# Patient Record
Sex: Female | Born: 1970
Health system: Southern US, Community
[De-identification: ages and names within clinical notes are randomized; demographics above are authoritative.]

## PROBLEM LIST (undated history)

## (undated) DIAGNOSIS — F419 Anxiety disorder, unspecified: Secondary | ICD-10-CM

## (undated) DIAGNOSIS — G709 Myoneural disorder, unspecified: Secondary | ICD-10-CM

## (undated) DIAGNOSIS — F329 Major depressive disorder, single episode, unspecified: Secondary | ICD-10-CM

## (undated) DIAGNOSIS — F32A Depression, unspecified: Secondary | ICD-10-CM

## (undated) HISTORY — PX: WISDOM TOOTH EXTRACTION: SHX21

---

## 2004-03-19 ENCOUNTER — Other Ambulatory Visit: Admission: RE | Admit: 2004-03-19 | Discharge: 2004-03-19 | Payer: Self-pay | Admitting: Family Medicine

## 2005-03-20 ENCOUNTER — Other Ambulatory Visit: Admission: RE | Admit: 2005-03-20 | Discharge: 2005-03-20 | Payer: Self-pay | Admitting: Internal Medicine

## 2006-05-04 ENCOUNTER — Other Ambulatory Visit: Admission: RE | Admit: 2006-05-04 | Discharge: 2006-05-04 | Payer: Self-pay | Admitting: Internal Medicine

## 2007-05-03 ENCOUNTER — Other Ambulatory Visit: Admission: RE | Admit: 2007-05-03 | Discharge: 2007-05-03 | Payer: Self-pay | Admitting: Internal Medicine

## 2008-05-12 ENCOUNTER — Other Ambulatory Visit: Admission: RE | Admit: 2008-05-12 | Discharge: 2008-05-12 | Payer: Self-pay | Admitting: Internal Medicine

## 2009-05-11 ENCOUNTER — Other Ambulatory Visit: Admission: RE | Admit: 2009-05-11 | Discharge: 2009-05-11 | Payer: Self-pay | Admitting: Geriatric Medicine

## 2010-05-24 ENCOUNTER — Other Ambulatory Visit: Admission: RE | Admit: 2010-05-24 | Discharge: 2010-05-24 | Payer: Self-pay | Admitting: Internal Medicine

## 2010-11-03 ENCOUNTER — Encounter: Payer: Self-pay | Admitting: Internal Medicine

## 2011-05-30 ENCOUNTER — Other Ambulatory Visit (HOSPITAL_COMMUNITY)
Admission: RE | Admit: 2011-05-30 | Discharge: 2011-05-30 | Disposition: A | Discharge status: Home / Self Care | Payer: PRIVATE HEALTH INSURANCE | Source: Ambulatory Visit | Attending: *Deleted | Admitting: *Deleted

## 2011-05-30 ENCOUNTER — Other Ambulatory Visit: Payer: Self-pay | Admitting: *Deleted

## 2011-05-30 DIAGNOSIS — Z01419 Encounter for gynecological examination (general) (routine) without abnormal findings: Secondary | ICD-10-CM | POA: Insufficient documentation

## 2012-07-05 ENCOUNTER — Other Ambulatory Visit: Payer: Self-pay | Admitting: Neurology

## 2012-07-05 DIAGNOSIS — H532 Diplopia: Secondary | ICD-10-CM

## 2012-07-07 ENCOUNTER — Ambulatory Visit
Admission: RE | Admit: 2012-07-07 | Discharge: 2012-07-07 | Disposition: A | Discharge status: Home / Self Care | Payer: BC Managed Care – PPO | Source: Ambulatory Visit | Attending: Neurology | Admitting: Neurology

## 2012-07-07 VITALS — BP 104/59 | HR 62

## 2012-07-07 DIAGNOSIS — H532 Diplopia: Secondary | ICD-10-CM

## 2012-07-07 LAB — CSF CELL COUNT WITH DIFFERENTIAL
RBC Count, CSF: 1 cu mm — ABNORMAL HIGH
Tube #: 4
WBC, CSF: 2 cu mm (ref 0–5)

## 2012-07-07 LAB — GLUCOSE, CSF: Glucose, CSF: 58 mg/dL (ref 43–76)

## 2012-07-07 LAB — PROTEIN, CSF: Total Protein, CSF: 40 mg/dL (ref 15–45)

## 2012-07-09 LAB — ANGIOTENSIN CONVERTING ENZYME, CSF: ACE, CSF: 3 U/L (ref <=15)

## 2012-07-10 LAB — CNS IGG SYNTHESIS RATE, CSF+BLOOD
Albumin, CSF: 24.2 mg/dL (ref 8.0–42.0)
Albumin, Serum(Neph): 4.5 g/dL (ref 3.5–4.9)
IgG Index, CSF: 0.65 (ref <0.66)
IgG, CSF: 2.7 mg/dL (ref 0.8–7.7)
IgG, Serum: 773 mg/dL (ref 694–1618)
MS CNS IgG Synthesis Rate: 1.3 mg/24 h (ref <=3.3)

## 2012-07-12 ENCOUNTER — Other Ambulatory Visit: Payer: Self-pay | Admitting: Neurology

## 2012-07-12 ENCOUNTER — Ambulatory Visit
Admission: RE | Admit: 2012-07-12 | Discharge: 2012-07-12 | Disposition: A | Discharge status: Home / Self Care | Payer: BC Managed Care – PPO | Source: Ambulatory Visit | Attending: Neurology | Admitting: Neurology

## 2012-07-12 VITALS — BP 115/66 | HR 67

## 2012-07-12 DIAGNOSIS — G971 Other reaction to spinal and lumbar puncture: Secondary | ICD-10-CM

## 2012-07-12 MED ORDER — IOHEXOL 180 MG/ML  SOLN
1.0000 mL | Freq: Once | INTRAMUSCULAR | Status: AC | PRN
  Administered 2012-07-12: 1 mL via EPIDURAL

## 2012-07-13 LAB — OLIGOCLONAL BANDS, CSF + SERM

## 2012-07-30 ENCOUNTER — Other Ambulatory Visit: Payer: Self-pay | Admitting: Obstetrics and Gynecology

## 2012-07-30 ENCOUNTER — Other Ambulatory Visit (HOSPITAL_COMMUNITY)
Admission: RE | Admit: 2012-07-30 | Discharge: 2012-07-30 | Disposition: A | Discharge status: Home / Self Care | Payer: BC Managed Care – PPO | Source: Ambulatory Visit | Attending: Obstetrics and Gynecology | Admitting: Obstetrics and Gynecology

## 2012-07-30 DIAGNOSIS — Z01419 Encounter for gynecological examination (general) (routine) without abnormal findings: Secondary | ICD-10-CM | POA: Insufficient documentation

## 2012-07-30 DIAGNOSIS — Z1151 Encounter for screening for human papillomavirus (HPV): Secondary | ICD-10-CM | POA: Insufficient documentation

## 2013-10-12 ENCOUNTER — Other Ambulatory Visit: Payer: Self-pay | Admitting: Psychiatry

## 2013-10-12 DIAGNOSIS — G35 Multiple sclerosis: Secondary | ICD-10-CM

## 2013-10-14 ENCOUNTER — Other Ambulatory Visit: Payer: BC Managed Care – PPO

## 2013-10-20 ENCOUNTER — Ambulatory Visit
Admission: RE | Admit: 2013-10-20 | Discharge: 2013-10-20 | Disposition: A | Discharge status: Home / Self Care | Payer: Federal, State, Local not specified - PPO | Source: Ambulatory Visit | Attending: Psychiatry | Admitting: Psychiatry

## 2013-10-20 DIAGNOSIS — G35 Multiple sclerosis: Secondary | ICD-10-CM

## 2013-10-20 MED ORDER — GADOBENATE DIMEGLUMINE 529 MG/ML IV SOLN
12.0000 mL | Freq: Once | INTRAVENOUS | Status: AC | PRN
  Administered 2013-10-20: 12 mL via INTRAVENOUS

## 2014-11-28 ENCOUNTER — Other Ambulatory Visit: Payer: Self-pay | Admitting: Psychiatry

## 2014-11-28 DIAGNOSIS — G35 Multiple sclerosis: Secondary | ICD-10-CM

## 2014-12-11 ENCOUNTER — Ambulatory Visit
Admission: RE | Admit: 2014-12-11 | Discharge: 2014-12-11 | Disposition: A | Discharge status: Home / Self Care | Payer: Federal, State, Local not specified - PPO | Source: Ambulatory Visit | Attending: Psychiatry | Admitting: Psychiatry

## 2014-12-11 DIAGNOSIS — G35 Multiple sclerosis: Secondary | ICD-10-CM

## 2014-12-11 MED ORDER — GADOBENATE DIMEGLUMINE 529 MG/ML IV SOLN
12.0000 mL | Freq: Once | INTRAVENOUS | Status: AC | PRN
  Administered 2014-12-11: 12 mL via INTRAVENOUS

## 2015-01-23 ENCOUNTER — Other Ambulatory Visit (HOSPITAL_COMMUNITY): Payer: Self-pay | Admitting: Psychiatry

## 2015-01-29 ENCOUNTER — Inpatient Hospital Stay (HOSPITAL_COMMUNITY): Admission: RE | Admit: 2015-01-29 | Payer: Federal, State, Local not specified - PPO | Source: Ambulatory Visit

## 2015-02-27 ENCOUNTER — Encounter (HOSPITAL_COMMUNITY): Payer: Federal, State, Local not specified - PPO

## 2015-03-27 ENCOUNTER — Encounter (HOSPITAL_COMMUNITY): Payer: Federal, State, Local not specified - PPO

## 2015-04-24 ENCOUNTER — Encounter (HOSPITAL_COMMUNITY): Payer: Federal, State, Local not specified - PPO

## 2015-10-18 ENCOUNTER — Other Ambulatory Visit (HOSPITAL_COMMUNITY)
Admission: RE | Admit: 2015-10-18 | Discharge: 2015-10-18 | Disposition: A | Discharge status: Home / Self Care | Payer: Federal, State, Local not specified - PPO | Source: Ambulatory Visit | Attending: Obstetrics and Gynecology | Admitting: Obstetrics and Gynecology

## 2015-10-18 ENCOUNTER — Other Ambulatory Visit: Payer: Self-pay | Admitting: Obstetrics and Gynecology

## 2015-10-18 DIAGNOSIS — Z1151 Encounter for screening for human papillomavirus (HPV): Secondary | ICD-10-CM | POA: Insufficient documentation

## 2015-10-18 DIAGNOSIS — Z01419 Encounter for gynecological examination (general) (routine) without abnormal findings: Secondary | ICD-10-CM | POA: Insufficient documentation

## 2015-10-19 ENCOUNTER — Other Ambulatory Visit: Payer: Self-pay | Admitting: Obstetrics and Gynecology

## 2015-10-19 DIAGNOSIS — Z1231 Encounter for screening mammogram for malignant neoplasm of breast: Secondary | ICD-10-CM

## 2015-10-22 LAB — CYTOLOGY - PAP

## 2015-10-29 ENCOUNTER — Other Ambulatory Visit: Payer: Self-pay | Admitting: Obstetrics and Gynecology

## 2015-11-08 ENCOUNTER — Ambulatory Visit: Payer: Federal, State, Local not specified - PPO

## 2015-11-13 ENCOUNTER — Ambulatory Visit: Payer: Federal, State, Local not specified - PPO

## 2015-11-20 ENCOUNTER — Other Ambulatory Visit: Payer: Self-pay | Admitting: Psychiatry

## 2015-11-20 DIAGNOSIS — G35 Multiple sclerosis: Secondary | ICD-10-CM

## 2015-11-25 ENCOUNTER — Other Ambulatory Visit: Payer: Federal, State, Local not specified - PPO

## 2015-12-09 ENCOUNTER — Ambulatory Visit
Admission: RE | Admit: 2015-12-09 | Discharge: 2015-12-09 | Disposition: A | Discharge status: Home / Self Care | Payer: Federal, State, Local not specified - PPO | Source: Ambulatory Visit | Attending: Psychiatry | Admitting: Psychiatry

## 2015-12-09 DIAGNOSIS — G35 Multiple sclerosis: Secondary | ICD-10-CM

## 2015-12-09 MED ORDER — GADOBENATE DIMEGLUMINE 529 MG/ML IV SOLN
12.0000 mL | Freq: Once | INTRAVENOUS | Status: AC | PRN
  Administered 2015-12-09: 12 mL via INTRAVENOUS

## 2015-12-14 ENCOUNTER — Encounter (HOSPITAL_COMMUNITY): Payer: Self-pay

## 2015-12-14 ENCOUNTER — Encounter (HOSPITAL_COMMUNITY)
Admission: RE | Admit: 2015-12-14 | Discharge: 2015-12-14 | Disposition: A | Discharge status: Home / Self Care | Payer: Federal, State, Local not specified - PPO | Source: Ambulatory Visit | Attending: Obstetrics and Gynecology | Admitting: Obstetrics and Gynecology

## 2015-12-14 DIAGNOSIS — Z01812 Encounter for preprocedural laboratory examination: Secondary | ICD-10-CM | POA: Diagnosis present

## 2015-12-14 HISTORY — DX: Anxiety disorder, unspecified: F41.9

## 2015-12-14 HISTORY — DX: Major depressive disorder, single episode, unspecified: F32.9

## 2015-12-14 HISTORY — DX: Myoneural disorder, unspecified: G70.9

## 2015-12-14 HISTORY — DX: Depression, unspecified: F32.A

## 2015-12-14 LAB — TYPE AND SCREEN
ABO/RH(D): AB NEG
Antibody Screen: NEGATIVE

## 2015-12-14 LAB — CBC
HCT: 41 % (ref 36.0–46.0)
Hemoglobin: 14.2 g/dL (ref 12.0–15.0)
MCH: 31.9 pg (ref 26.0–34.0)
MCHC: 34.6 g/dL (ref 30.0–36.0)
MCV: 92.1 fL (ref 78.0–100.0)
Platelets: 229 10*3/uL (ref 150–400)
RBC: 4.45 MIL/uL (ref 3.87–5.11)
RDW: 12.7 % (ref 11.5–15.5)
WBC: 6.4 10*3/uL (ref 4.0–10.5)

## 2015-12-14 LAB — ABO/RH: ABO/RH(D): AB NEG

## 2015-12-14 NOTE — Patient Instructions (Signed)
Your procedure is scheduled on: Friday December 21, 2015    Enter through the Main Entrance of [facility] at: 9:00 am   Pick up the phone at the desk and dial [phone].  Call this number if you have problems the morning of surgery: [phone].  Remember: Do NOT eat food: after midnight on Thursday  Do NOT drink clear liquids after: after midnight on Thursday  Take these medicines the morning of surgery with a SIP OF WATER: Adderall   Do NOT wear jewelry (body piercing), metal hair clips/bobby pins, make-up, or nail polish. Do NOT wear lotions, powders, or perfumes.  You may wear deoderant. Do NOT shave for 48 hours prior to surgery. Do NOT bring valuables to the hospital. Contacts, dentures, or bridgework may not be worn into surgery.  Have a responsible adult drive you home and stay with you for 24 hours after your procedure

## 2015-12-21 ENCOUNTER — Ambulatory Visit (HOSPITAL_COMMUNITY): Payer: Federal, State, Local not specified - PPO | Admitting: Anesthesiology

## 2015-12-21 ENCOUNTER — Encounter (HOSPITAL_COMMUNITY)
Admission: RE | Disposition: A | Discharge status: Home / Self Care | Payer: Self-pay | Source: Ambulatory Visit | Attending: Obstetrics and Gynecology

## 2015-12-21 ENCOUNTER — Encounter (HOSPITAL_COMMUNITY): Payer: Self-pay | Admitting: Anesthesiology

## 2015-12-21 ENCOUNTER — Ambulatory Visit (HOSPITAL_COMMUNITY)
Admission: RE | Admit: 2015-12-21 | Discharge: 2015-12-21 | Disposition: A | Discharge status: Home / Self Care | Payer: Federal, State, Local not specified - PPO | Source: Ambulatory Visit | Attending: Obstetrics and Gynecology | Admitting: Obstetrics and Gynecology

## 2015-12-21 DIAGNOSIS — D069 Carcinoma in situ of cervix, unspecified: Secondary | ICD-10-CM | POA: Insufficient documentation

## 2015-12-21 DIAGNOSIS — M5430 Sciatica, unspecified side: Secondary | ICD-10-CM | POA: Diagnosis not present

## 2015-12-21 DIAGNOSIS — G35 Multiple sclerosis: Secondary | ICD-10-CM | POA: Diagnosis not present

## 2015-12-21 DIAGNOSIS — E538 Deficiency of other specified B group vitamins: Secondary | ICD-10-CM | POA: Diagnosis not present

## 2015-12-21 DIAGNOSIS — Z87891 Personal history of nicotine dependence: Secondary | ICD-10-CM | POA: Insufficient documentation

## 2015-12-21 HISTORY — PX: CERVICAL CONIZATION W/BX: SHX1330

## 2015-12-21 LAB — PREGNANCY, URINE: Preg Test, Ur: NEGATIVE

## 2015-12-21 SURGERY — CONE BIOPSY, CERVIX
Anesthesia: General | Site: Cervix

## 2015-12-21 MED ORDER — LIDOCAINE-EPINEPHRINE 1 %-1:100000 IJ SOLN
INTRAMUSCULAR | Status: DC | PRN
  Administered 2015-12-21: 13 mL

## 2015-12-21 MED ORDER — CEFAZOLIN SODIUM-DEXTROSE 2-3 GM-% IV SOLR
INTRAVENOUS | Status: AC
  Filled 2015-12-21: qty 50

## 2015-12-21 MED ORDER — FENTANYL CITRATE (PF) 100 MCG/2ML IJ SOLN
INTRAMUSCULAR | Status: AC
Start: 2015-12-21 — End: 2015-12-21
  Filled 2015-12-21: qty 2

## 2015-12-21 MED ORDER — FENTANYL CITRATE (PF) 100 MCG/2ML IJ SOLN: 25.0000 ug | INTRAMUSCULAR | Status: DC | PRN

## 2015-12-21 MED ORDER — DEXAMETHASONE SODIUM PHOSPHATE 4 MG/ML IJ SOLN
INTRAMUSCULAR | Status: DC | PRN
  Administered 2015-12-21: 4 mg via INTRAVENOUS

## 2015-12-21 MED ORDER — METOCLOPRAMIDE HCL 5 MG/ML IJ SOLN: 10.0000 mg | Freq: Once | INTRAMUSCULAR | Status: DC | PRN

## 2015-12-21 MED ORDER — KETOROLAC TROMETHAMINE 30 MG/ML IJ SOLN
INTRAMUSCULAR | Status: AC
  Filled 2015-12-21: qty 1

## 2015-12-21 MED ORDER — LIDOCAINE HCL (CARDIAC) 20 MG/ML IV SOLN
INTRAVENOUS | Status: AC
  Filled 2015-12-21: qty 5

## 2015-12-21 MED ORDER — IODINE STRONG (LUGOLS) 5 % PO SOLN
ORAL | Status: AC
  Filled 2015-12-21: qty 1

## 2015-12-21 MED ORDER — ONDANSETRON HCL 4 MG/2ML IJ SOLN
INTRAMUSCULAR | Status: AC
  Filled 2015-12-21: qty 2

## 2015-12-21 MED ORDER — PROPOFOL 10 MG/ML IV BOLUS
INTRAVENOUS | Status: DC | PRN
  Administered 2015-12-21: 130 mg via INTRAVENOUS

## 2015-12-21 MED ORDER — CEFAZOLIN SODIUM-DEXTROSE 2-3 GM-% IV SOLR
2.0000 g | INTRAVENOUS | Status: AC
  Administered 2015-12-21: 2 g via INTRAVENOUS

## 2015-12-21 MED ORDER — OXYTOCIN 10 UNIT/ML IJ SOLN
INTRAMUSCULAR | Status: AC
Start: 2015-12-21 — End: 2015-12-21
  Filled 2015-12-21: qty 2

## 2015-12-21 MED ORDER — FENTANYL CITRATE (PF) 100 MCG/2ML IJ SOLN
INTRAMUSCULAR | Status: DC | PRN
  Administered 2015-12-21: 100 ug via INTRAVENOUS

## 2015-12-21 MED ORDER — MIDAZOLAM HCL 5 MG/5ML IJ SOLN
INTRAMUSCULAR | Status: DC | PRN
  Administered 2015-12-21: 2 mg via INTRAVENOUS

## 2015-12-21 MED ORDER — IODINE STRONG (LUGOLS) 5 % PO SOLN
ORAL | Status: DC | PRN
  Administered 2015-12-21: 0.2 mL via ORAL

## 2015-12-21 MED ORDER — CITRIC ACID-SODIUM CITRATE 334-500 MG/5ML PO SOLN
30.0000 mL | ORAL | Status: AC
  Administered 2015-12-21: 30 mL via ORAL

## 2015-12-21 MED ORDER — HYDROCODONE-ACETAMINOPHEN 7.5-325 MG PO TABS: 1.0000 | ORAL_TABLET | Freq: Once | ORAL | Status: DC | PRN

## 2015-12-21 MED ORDER — LACTATED RINGERS IV SOLN
INTRAVENOUS | Status: DC
  Administered 2015-12-21 (×2): via INTRAVENOUS

## 2015-12-21 MED ORDER — FERRIC SUBSULFATE 259 MG/GM EX SOLN
CUTANEOUS | Status: AC
  Filled 2015-12-21: qty 8

## 2015-12-21 MED ORDER — CITRIC ACID-SODIUM CITRATE 334-500 MG/5ML PO SOLN
ORAL | Status: AC
  Administered 2015-12-21: 30 mL via ORAL
  Filled 2015-12-21: qty 15

## 2015-12-21 MED ORDER — LIDOCAINE-EPINEPHRINE 1 %-1:100000 IJ SOLN
INTRAMUSCULAR | Status: AC
  Filled 2015-12-21: qty 1

## 2015-12-21 MED ORDER — PROPOFOL 10 MG/ML IV BOLUS
INTRAVENOUS | Status: AC
  Filled 2015-12-21: qty 20

## 2015-12-21 MED ORDER — DEXAMETHASONE SODIUM PHOSPHATE 4 MG/ML IJ SOLN
INTRAMUSCULAR | Status: AC
Start: 2015-12-21 — End: 2015-12-21
  Filled 2015-12-21: qty 1

## 2015-12-21 MED ORDER — METOCLOPRAMIDE HCL 5 MG/ML IJ SOLN
INTRAMUSCULAR | Status: DC | PRN
  Administered 2015-12-21: 10 mg via INTRAVENOUS

## 2015-12-21 MED ORDER — MEPERIDINE HCL 25 MG/ML IJ SOLN: 6.2500 mg | INTRAMUSCULAR | Status: DC | PRN

## 2015-12-21 MED ORDER — LIDOCAINE HCL (CARDIAC) 20 MG/ML IV SOLN
INTRAVENOUS | Status: DC | PRN
  Administered 2015-12-21 (×2): 30 mg via INTRAVENOUS

## 2015-12-21 MED ORDER — METOCLOPRAMIDE HCL 5 MG/ML IJ SOLN
INTRAMUSCULAR | Status: AC
  Filled 2015-12-21: qty 2

## 2015-12-21 MED ORDER — SCOPOLAMINE 1 MG/3DAYS TD PT72
MEDICATED_PATCH | TRANSDERMAL | Status: AC
  Administered 2015-12-21: 1.5 mg via TRANSDERMAL
  Filled 2015-12-21: qty 1

## 2015-12-21 MED ORDER — IBUPROFEN 800 MG PO TABS: 800.0000 mg | ORAL_TABLET | Freq: Three times a day (TID) | ORAL | Status: DC | PRN

## 2015-12-21 MED ORDER — SCOPOLAMINE 1 MG/3DAYS TD PT72
1.0000 | MEDICATED_PATCH | Freq: Once | TRANSDERMAL | Status: DC
  Administered 2015-12-21: 1.5 mg via TRANSDERMAL

## 2015-12-21 MED ORDER — ACETIC ACID 5 % SOLN
Status: AC
  Filled 2015-12-21: qty 500

## 2015-12-21 MED ORDER — ONDANSETRON HCL 4 MG/2ML IJ SOLN
INTRAMUSCULAR | Status: DC | PRN
  Administered 2015-12-21: 4 mg via INTRAVENOUS

## 2015-12-21 MED ORDER — MIDAZOLAM HCL 2 MG/2ML IJ SOLN
INTRAMUSCULAR | Status: AC
  Filled 2015-12-21: qty 2

## 2015-12-21 MED ORDER — KETOROLAC TROMETHAMINE 30 MG/ML IJ SOLN
INTRAMUSCULAR | Status: DC | PRN
  Administered 2015-12-21: 30 mg via INTRAVENOUS

## 2015-12-21 MED ORDER — MICROFIBRILLAR COLL HEMOSTAT EX POWD
CUTANEOUS | Status: AC
  Filled 2015-12-21: qty 5

## 2015-12-21 SURGICAL SUPPLY — 32 items
APPLICATOR ARISTA FLEXITIP XL (MISCELLANEOUS) ×2 IMPLANT
APPLICATOR COTTON TIP 6IN STRL (MISCELLANEOUS) ×2 IMPLANT
BLADE SURG 11 STRL SS (BLADE) ×2 IMPLANT
CATH ROBINSON RED A/P 16FR (CATHETERS) ×2 IMPLANT
CLOTH BEACON ORANGE TIMEOUT ST (SAFETY) ×2 IMPLANT
CONTAINER PREFILL 10% NBF 60ML (FORM) ×2 IMPLANT
COUNTER NEEDLE 1200 MAGNETIC (NEEDLE) ×2 IMPLANT
ELECT BALL LEEP 5MM RED (ELECTRODE) ×2 IMPLANT
ELECT REM PT RETURN 9FT ADLT (ELECTROSURGICAL) ×2
ELECTRODE REM PT RTRN 9FT ADLT (ELECTROSURGICAL) ×1 IMPLANT
GLOVE BIO SURGEON STRL SZ7 (GLOVE) ×2 IMPLANT
GLOVE BIOGEL PI IND STRL 7.0 (GLOVE) ×2 IMPLANT
GLOVE BIOGEL PI INDICATOR 7.0 (GLOVE) ×2
GOWN STRL REUS W/TWL LRG LVL3 (GOWN DISPOSABLE) ×4 IMPLANT
HEMOSTAT ARISTA ABSORB 3G PWDR (MISCELLANEOUS) ×2 IMPLANT
HEMOSTAT SURGICEL 2X3 (HEMOSTASIS) IMPLANT
NS IRRIG 1000ML POUR BTL (IV SOLUTION) ×2 IMPLANT
PACK VAGINAL MINOR WOMEN LF (CUSTOM PROCEDURE TRAY) ×2 IMPLANT
PAD OB MATERNITY 4.3X12.25 (PERSONAL CARE ITEMS) ×2 IMPLANT
PENCIL BUTTON HOLSTER BLD 10FT (ELECTRODE) IMPLANT
PENCIL SMOKE EVAC W/HOLSTER (ELECTROSURGICAL) ×2 IMPLANT
SCOPETTES 8  STERILE (MISCELLANEOUS) ×2
SCOPETTES 8 STERILE (MISCELLANEOUS) ×2 IMPLANT
SPONGE SURGIFOAM ABS GEL 12-7 (HEMOSTASIS) IMPLANT
SUT VIC AB 0 CT1 27 (SUTURE) ×2
SUT VIC AB 0 CT1 27XBRD ANBCTR (SUTURE) ×2 IMPLANT
SUT VIC AB 3-0 CT1 27 (SUTURE) ×3
SUT VIC AB 3-0 CT1 TAPERPNT 27 (SUTURE) ×3 IMPLANT
TOWEL OR 17X24 6PK STRL BLUE (TOWEL DISPOSABLE) ×4 IMPLANT
TUBING NON-CON 1/4 X 20 CONN (TUBING) ×2 IMPLANT
WATER STERILE IRR 1000ML POUR (IV SOLUTION) ×2 IMPLANT
YANKAUER SUCT BULB TIP NO VENT (SUCTIONS) ×2 IMPLANT

## 2015-12-21 NOTE — Transfer of Care (Signed)
Immediate Anesthesia Transfer of Care Note  Patient: Deborah Collins  Procedure(s) Performed: Procedure(s): CONIZATION CERVIX WITH BIOPSY (N/A)  Patient Location: PACU  Anesthesia Type:General  Level of Consciousness: awake, oriented and patient cooperative  Airway & Oxygen Therapy: Patient Spontanous Breathing and Patient connected to nasal cannula oxygen  Post-op Assessment: Report given to RN and Post -op Vital signs reviewed and stable  Post vital signs: Reviewed and stable  Last Vitals:  Filed Vitals:   12/21/15 0744  BP: 106/53  Pulse: 81  Temp: 36.7 C  Resp: 20    Complications: No apparent anesthesia complications

## 2015-12-21 NOTE — Brief Op Note (Signed)
12/21/2015  10:25 AM  PATIENT:  Deborah Collins  45 y.o. female  PRE-OPERATIVE DIAGNOSIS:  R87.613severe cervical dysplasia, MS on immunosuppressive meds  POST-OPERATIVE DIAGNOSIS:  severe cervical dysplasia  PROCEDURE:  Procedure(s): CONIZATION CERVIX WITH BIOPSY (N/A)  SURGEON:  Surgeon(s) and Role:    * Geryl Rankins, MD - Primary  PHYSICIAN ASSISTANT:   ASSISTANTS: Technician   ANESTHESIA:   local and general  EBL:  Total I/O In: 1400 [I.V.:1400] Out: 110 [Urine:100; Blood:10]  BLOOD ADMINISTERED:none  DRAINS: none   LOCAL MEDICATIONS USED:  LIDOCAINE  and OTHER w/ epinephrine 1:100,000  SPECIMEN:  Source of Specimen:  Cone biopsy of cervix, endocervical currettings  DISPOSITION OF SPECIMEN:  PATHOLOGY  COUNTS:  YES  TOURNIQUET:  * No tourniquets in log *  DICTATION: .Other Dictation: Dictation Number [id number]  PLAN OF CARE: Discharge to home after PACU  PATIENT DISPOSITION:  PACU - hemodynamically stable.   Delay start of Pharmacological VTE agent (>24hrs) due to surgical blood loss or risk of bleeding: yes

## 2015-12-21 NOTE — Anesthesia Procedure Notes (Signed)
Procedure Name: LMA Insertion Date/Time: 12/21/2015 9:24 AM Performed by: Janeece Agee Pre-anesthesia Checklist: Patient identified, Emergency Drugs available, Suction available, Patient being monitored and Timeout performed Patient Re-evaluated:Patient Re-evaluated prior to inductionOxygen Delivery Method: Circle system utilized Preoxygenation: Pre-oxygenation with 100% oxygen Intubation Type: IV induction LMA: LMA inserted LMA Size: 4.0 Grade View: Grade I Number of attempts: 1 Placement Confirmation: positive ETCO2 and breath sounds checked- equal and bilateral Dental Injury: Teeth and Oropharynx as per pre-operative assessment

## 2015-12-21 NOTE — Progress Notes (Signed)
Patient ID: Deborah Collins, female   DOB: 12/13/1970, 45 y.o.   MRN: [id number]   History of Present Illness  General:  Pt presents for colposcopy due to HGSIL/HPV+ pap.   Vital Signs  Wt 139, Wt change -.4 lb, Ht 69, BMI 20.52, Pulse sitting 103, BP sitting 122/82.   Physical Examination  GENERAL:  Patient appears alert and oriented.  General Appearance: well-appearing, well-developed, no acute distress.  Speech: clear.  FEMALE GENITOURINARY:  Cervix visualized, healthy appearing, no discharge, no lesions.  Vagina: pink/moist mucosa, no lesions, no abnormal discharge.  Vulva: normal, no lesions, no skin discoloration.     Current Medications  Taking   Valcyclovir 500 mg BID/5d 500 mg tabs one tab twice a day   Tysabri 300 MG/15ML Concentrate 15 ml monthly   Gabapentin 300 MG Capsule 2 capsule at bedtime   Klonopin 1 MG Tablet 1 tablet at bedtime as needed   Loestrin Fe 1/20 1-20 MG-MCG Tablet 1 tablet Daily for Three Weeks, 1 Week off   Adderall 20 mg 20 MG Capsule Extended Release 24 Hour 1 capsule in the morning twice a day    Past Medical History  Attention deficit disorder  Fever blisters  Allergic rhinitis  B12 deficiency  sciatica - has responded well to acupuncture by Damian Leavell  Insomnia  MS (dx 06/2012) - sxs possibly 7-10 yrs ago - Dr. Eduard Roux  Herpetic Whitlow right index finger - 03/2013  HSV 2   Surgical History  Denies Past Surgical History   Family History  Father: alive, hepatitis C, cirrhosis, diagnosed with HTN, Cirrhosis  Mother: deceased, esophageal cancer  denies any GYN family cancer hx.   Social History  General:  Tobacco use  cigarettes: Former smoker Quit in year 2014 Tobacco history last updated 10/29/2015 Additional Findings: Tobacco Non-User Non-smoker for personal reasons EXPOSURE TO PASSIVE SMOKE: quit January 2014, slipped up a couple of times.  Alcohol: yes, occasionally.  Caffeine: yes.  no Recreational drug use.   Exercise: 6x a week.  Marital Status: Single.  Children: none.  OCCUPATION: Working for [employer].    Gyn History  Sexual activity currently sexually active.  Periods : every month.  LMP 10/18/15.  Birth control ocp.  Last pap smear date 07/30/12, all negative, 10/18/15, HGSIL, + HPV.  Denies H/O Last mammogram date.  Denies H/O Abnormal pap smear.  Denies H/O STD.    OB History  Never been pregnant per patient.    Allergies  Lexapro: angry  Septra DS: hives   Hospitalization/Major Diagnostic Procedure  Denies Past Hospitalization   Assessments   1. HGSIL on Pap smear of cervix - R87.613 (Primary)   2. Cervical high risk HPV (human papillomavirus) test positive - R87.810   3. Negative pregnancy test - Z32.02   Treatment  1. HGSIL on Pap smear of cervix  PROCEDURE: GYN COLPO CERVIX & ADJACENT VAG W/BX  Biopsy showed HGSIL on cervix and ECC.  Recommended [facility].   2. Negative pregnancy test  LAB: Pregnancy Test, Urine   Procedures  Colposcopy:  Consent Consent signed. Possible risks explained to patient who states understanding.Marland Kitchen  Results See drawing for further details.  Type of lesion Acetowhite. Mosaicism, Biospy (11,1,6).  ECC done.  Impression CIN II.  Future management Repeat pap in 1 year if CIN 1, LEEP if CIN 2..        Labs    Lab: Pregnancy Test, Urine  Pregnancy Test, Urine negative  Notes :Allman,Michelle 10/29/2015 12:16:34 PM > Dr. V informed.     

## 2015-12-21 NOTE — Anesthesia Preprocedure Evaluation (Signed)
Anesthesia Evaluation  Patient identified by MRN, date of birth, ID band Patient awake    Reviewed: Allergy & Precautions, NPO status , Patient's Chart, lab work & pertinent test results  Airway Mallampati: I  TM Distance: >3 FB Neck ROM: Full    Dental no notable dental hx. (+) Teeth Intact   Pulmonary former smoker,    Pulmonary exam normal breath sounds clear to auscultation       Cardiovascular negative cardio ROS Normal cardiovascular exam Rhythm:Regular Rate:Normal     Neuro/Psych PSYCHIATRIC DISORDERS Anxiety Depression ADDMS diagnosed 3years ago on injections  Neuromuscular disease    GI/Hepatic negative GI ROS, Neg liver ROS,   Endo/Other  negative endocrine ROS  Renal/GU negative Renal ROS  negative genitourinary   Musculoskeletal negative musculoskeletal ROS (+)   Abdominal   Peds  Hematology negative hematology ROS (+)   Anesthesia Other Findings   Reproductive/Obstetrics Cervical dysplasia                             Anesthesia Physical Anesthesia Plan  ASA: II  Anesthesia Plan: General   Post-op Pain Management:    Induction: Intravenous  Airway Management Planned: LMA  Additional Equipment:   Intra-op Plan:   Post-operative Plan: Extubation in OR  Informed Consent: I have reviewed the patients History and Physical, chart, labs and discussed the procedure including the risks, benefits and alternatives for the proposed anesthesia with the patient or authorized representative who has indicated his/her understanding and acceptance.   Dental advisory given  Plan Discussed with: CRNA, Anesthesiologist and Surgeon  Anesthesia Plan Comments:         Anesthesia Quick Evaluation

## 2015-12-21 NOTE — Discharge Instructions (Addendum)
Conization of the Cervix   No Motrin until after 4PM today. Cervical conization is the cutting (excision) of a cone-shaped portion of the cervix. The procedure is performed through the vagina in either your health care provider's office or an operating room. This procedure is usually done when there is abnormal bleeding from the cervix. It can also be done to evaluate an abnormal Pap test or if an abnormality is seen on the cervix during an exam. The tissue is then examined to see if there are precancerous cells or cancer present.  Conization of the cervix is not done during a menstrual period or pregnancy.  LET [facility] CARE PROVIDER KNOW ABOUT:  Any allergies you have.   All medicines you are taking, including vitamins, herbs, eye drops, creams, and over-the-counter medicines.   Previous problems you or members of your family have had with the use of anesthetics.   Any blood disorders you have.   Previous surgeries you have had.   Medical conditions you have.   Your smoking habits.   The possibility of being pregnant.  RISKS AND COMPLICATIONS  Generally, conization of the cervix is a safe procedure. However, as with any procedure, complications can occur. Possible complications include:  Heavy bleeding several days or weeks after the procedure. Light bleeding or spotting after the procedure is normal.  Infection (rare).  Damage to the cervix or surrounding organs (uncommon).   Problems with the anesthesia.   Increased risk of preterm labor in future pregnancies. BEFORE THE PROCEDURE  Do not eat or drink anything for 6-8 hours before the procedure.   Do not take aspirin or blood thinners for at least a week before the procedure or as directed by your health care provider.   Arrange for someone to take you home after the procedure.  PROCEDURE There are three different methods to perform conization of the cervix. These include:   The cold knife method. In  this method a small cone-shaped sample of tissue is cut out with a knife (scalpel) from the cervical canal and the transformation zone (where the normal cells end and the abnormal cells begin).   The LEEP method. In this method a small cone-shaped sample of tissue is cut out with a thin wire that can burn (cauterize) the cervical tissue with an electrical current.   Laser treatment. In this method a small cone-shaped sample of tissue is cut out and then cauterized with a laser beam to prevent bleeding.  The procedure will be performed as follows:   Depending on the method, you will either be given a medicine to make you sleep (general anesthetic) or a numbing medicine (local anesthetic). A medicine that numbs the cervix (cervical block) may be given.   A lubricated device called a speculum will be inserted into the vagina to spread open the walls of the vagina. This will help your health care provider see the inside of the vagina and cervix better.   The tissue from the cervix will be removed and examined.   The results of the procedure will help your health care provider decide if further treatment is necessary. They will also help your health care provider decide on the best treatment if your results are abnormal. AFTER THE PROCEDURE  If you had a general anesthetic, you may be groggy for 2-3 hours after the procedure.   If you had a local anesthetic, you will rest at the clinic or hospital until you are stable and feel ready to  go home.   Recovery may take up to 3 weeks.   You may have some cramping for about 1 week.   You may have bloody discharge or light bleeding for 1-2 weeks.   You may have black discharge coming from the vagina. This is from the paste used on the cervix to prevent bleeding. This is normal discharge.    This information is not intended to replace advice given to you by your health care provider. Make sure you discuss any questions you have with your  health care provider.   Document Released: 07/09/2005 Document Revised: 10/04/2013 Document Reviewed: 03/25/2013 Elsevier Interactive Patient Education [employer]

## 2015-12-21 NOTE — Anesthesia Postprocedure Evaluation (Signed)
Anesthesia Post Note  Patient: Deborah Collins  Procedure(s) Performed: Procedure(s) (LRB): CONIZATION CERVIX WITH BIOPSY (N/A)  Patient location during evaluation: PACU Anesthesia Type: General Level of consciousness: awake and alert and oriented Pain management: pain level controlled Vital Signs Assessment: post-procedure vital signs reviewed and stable Respiratory status: spontaneous breathing, nonlabored ventilation and respiratory function stable Cardiovascular status: blood pressure returned to baseline and stable Postop Assessment: no signs of nausea or vomiting Anesthetic complications: no    Last Vitals:  Filed Vitals:   12/21/15 1030 12/21/15 1045  BP: 96/56 100/56  Pulse: 63 61  Temp:    Resp: 17 17    Last Pain: There were no vitals filed for this visit.               Jamy Whyte A.

## 2015-12-21 NOTE — Interval H&P Note (Signed)
History and Physical Interval Note:  12/21/2015 9:11 AM  Deborah Collins  has presented today for surgery, with the diagnosis of R87.613 HGSIL  The various methods of treatment have been discussed with the patient and family. After consideration of risks, benefits and other options for treatment, the patient has consented to  Procedure(s): CONIZATION CERVIX WITH BIOPSY (N/A) as a surgical intervention .  The patient's history has been reviewed, patient examined, no change in status, stable for surgery.  I have reviewed the patient's chart and labs.  Questions were answered to the patient's satisfaction.    Pt denies chest pain, SOB, dizziness.  No abnormal discharge.  Pictures drawn, procedure discussed.  Dion Body, Simcha Farrington

## 2015-12-21 NOTE — H&P (View-Only) (Signed)
Patient ID: Deborah Collins, female   DOB: 12/13/1970, 45 y.o.   MRN: [id number]   History of Present Illness  General:  Pt presents for colposcopy due to HGSIL/HPV+ pap.   Vital Signs  Wt 139, Wt change -.4 lb, Ht 69, BMI 20.52, Pulse sitting 103, BP sitting 122/82.   Physical Examination  GENERAL:  Patient appears alert and oriented.  General Appearance: well-appearing, well-developed, no acute distress.  Speech: clear.  FEMALE GENITOURINARY:  Cervix visualized, healthy appearing, no discharge, no lesions.  Vagina: pink/moist mucosa, no lesions, no abnormal discharge.  Vulva: normal, no lesions, no skin discoloration.     Current Medications  Taking   Valcyclovir 500 mg BID/5d 500 mg tabs one tab twice a day   Tysabri 300 MG/15ML Concentrate 15 ml monthly   Gabapentin 300 MG Capsule 2 capsule at bedtime   Klonopin 1 MG Tablet 1 tablet at bedtime as needed   Loestrin Fe 1/20 1-20 MG-MCG Tablet 1 tablet Daily for Three Weeks, 1 Week off   Adderall 20 mg 20 MG Capsule Extended Release 24 Hour 1 capsule in the morning twice a day    Past Medical History  Attention deficit disorder  Fever blisters  Allergic rhinitis  B12 deficiency  sciatica - has responded well to acupuncture by Damian Leavell  Insomnia  MS (dx 06/2012) - sxs possibly 7-10 yrs ago - Dr. Eduard Roux  Herpetic Whitlow right index finger - 03/2013  HSV 2   Surgical History  Denies Past Surgical History   Family History  Father: alive, hepatitis C, cirrhosis, diagnosed with HTN, Cirrhosis  Mother: deceased, esophageal cancer  denies any GYN family cancer hx.   Social History  General:  Tobacco use  cigarettes: Former smoker Quit in year 2014 Tobacco history last updated 10/29/2015 Additional Findings: Tobacco Non-User Non-smoker for personal reasons EXPOSURE TO PASSIVE SMOKE: quit January 2014, slipped up a couple of times.  Alcohol: yes, occasionally.  Caffeine: yes.  no Recreational drug use.   Exercise: 6x a week.  Marital Status: Single.  Children: none.  OCCUPATION: Working for [employer].    Gyn History  Sexual activity currently sexually active.  Periods : every month.  LMP 10/18/15.  Birth control ocp.  Last pap smear date 07/30/12, all negative, 10/18/15, HGSIL, + HPV.  Denies H/O Last mammogram date.  Denies H/O Abnormal pap smear.  Denies H/O STD.    OB History  Never been pregnant per patient.    Allergies  Lexapro: angry  Septra DS: hives   Hospitalization/Major Diagnostic Procedure  Denies Past Hospitalization   Assessments   1. HGSIL on Pap smear of cervix - R87.613 (Primary)   2. Cervical high risk HPV (human papillomavirus) test positive - R87.810   3. Negative pregnancy test - Z32.02   Treatment  1. HGSIL on Pap smear of cervix  PROCEDURE: GYN COLPO CERVIX & ADJACENT VAG W/BX  Biopsy showed HGSIL on cervix and ECC.  Recommended [facility].   2. Negative pregnancy test  LAB: Pregnancy Test, Urine   Procedures  Colposcopy:  Consent Consent signed. Possible risks explained to patient who states understanding.Marland Kitchen  Results See drawing for further details.  Type of lesion Acetowhite. Mosaicism, Biospy (11,1,6).  ECC done.  Impression CIN II.  Future management Repeat pap in 1 year if CIN 1, LEEP if CIN 2..        Labs    Lab: Pregnancy Test, Urine  Pregnancy Test, Urine negative  Notes :Allman,Michelle 10/29/2015 12:16:34 PM > Dr. Seth Bake informed.

## 2015-12-22 ENCOUNTER — Encounter (HOSPITAL_COMMUNITY): Payer: Self-pay | Admitting: Obstetrics and Gynecology

## 2015-12-27 ENCOUNTER — Ambulatory Visit: Payer: Federal, State, Local not specified - PPO

## 2015-12-28 NOTE — Op Note (Signed)
NAMEMarland Kitchen  RUBLE, CHMURA NO.:  [id number]  MEDICAL RECORD NO.:  [id number]  LOCATION:  [facility]                          FACILITY:  [facility]  PHYSICIAN:  Pieter Partridge, MD   DATE OF BIRTH:  1970-11-21  DATE OF PROCEDURE:  12/21/2015 DATE OF DISCHARGE:  12/21/2015                              OPERATIVE REPORT   PREOPERATIVE DIAGNOSES:  Severe cervical dysplasia, the patient has multiple scleroses on immunosuppressive medications.  POSTOPERATIVE DIAGNOSES:  Severe cervical dysplasia, the patient has multiple scleroses on immunosuppressive medications.  PROCEDURE:  Conization of cervix.  SURGEON:  Shela Nevin. Dion Body, M.D.  ASSISTANT:  Technician.  ESTIMATED BLOOD LOSS:  10.  ANESTHESIA:  Local and general.  Local is lidocaine with epinephrine 1:100,000.  DRAINS:  No drains.  SPECIMENS:  Cone biopsy of the cervix and endocervical curetting. Specimen to Pathology.  DISPOSITION:  To PACU hemodynamically stable.  FINDINGS:  Normal-appearing cervix and vagina.  Endocervical canal approximately 2-3 cm.  INDICATIONS:  Ms. Bumgarner is a 45 year old, who gets routine preventive care.  She also has MS and is on immunosuppressants.  The patient has had normal path, so she had a high grade Pap.  Colposcopy was suspicious for severe dysplasia, biopsy came back as CIN 2 and 3, and the endocervix was also positive for high-grade dysplasia.  The patient was consented on more definitive management especially due to her immunosuppressive state.  DESCRIPTION OF PROCEDURE:  The patient was identified in the holding area.  She was then taken to the operating room.  She was given Ancef. SCDs were placed.  She was prepped and draped in a normal sterile fashion.  A time-out was performed.  A weighted speculum and a Deaver were then placed into the vagina.  The cervix was visualized.  A 0 Vicryl stitch was placed at the junction of the cervix and fornices at 9 and 3 o'clock and  that was held with a hemostat, then a paracervical block was performed after Lugol's was applied to the cervix.  Once the lidocaine was administered, 11 blade was used to circumscribe the area past the demarcation of the Lugol's.  An angled knife was then used to remove the cone portion of the cervix that was tagged at 12 o'clock.  I did check the endocervical canal and it was only about little under 3 cm.  So adequate portion of the endocervix was removed.  Endocervical curetting was done of the remaining portion of the endocervix.  The patient was oozing, rollerball was used but there was still some active bleeding from certain section.  I did put a few figure-of-eight stitches to help slow down the bleeding.  I then used the Aricept power and hemostasis was appropriate.  Sutures were cut and all instruments were removed from the vagina.  There were no lacerations or abrasions. The patient tolerated the procedure well.  All instrument, sponge, and needle counts were correct x3.  She was taken to the recovery room in stable condition.     Pieter Partridge, MD     EBV/MEDQ  D:  12/28/2015  T:  12/28/2015  Job:  [id number]

## 2016-01-28 DIAGNOSIS — Z79899 Other long term (current) drug therapy: Secondary | ICD-10-CM | POA: Diagnosis not present

## 2016-01-28 DIAGNOSIS — G35 Multiple sclerosis: Secondary | ICD-10-CM | POA: Diagnosis not present

## 2016-01-28 DIAGNOSIS — F988 Other specified behavioral and emotional disorders with onset usually occurring in childhood and adolescence: Secondary | ICD-10-CM | POA: Diagnosis not present

## 2016-01-31 DIAGNOSIS — G35 Multiple sclerosis: Secondary | ICD-10-CM | POA: Diagnosis not present

## 2016-02-28 DIAGNOSIS — G35 Multiple sclerosis: Secondary | ICD-10-CM | POA: Diagnosis not present

## 2016-03-17 DIAGNOSIS — D72819 Decreased white blood cell count, unspecified: Secondary | ICD-10-CM | POA: Diagnosis not present

## 2016-03-17 DIAGNOSIS — Z79899 Other long term (current) drug therapy: Secondary | ICD-10-CM | POA: Diagnosis not present

## 2016-03-17 DIAGNOSIS — F988 Other specified behavioral and emotional disorders with onset usually occurring in childhood and adolescence: Secondary | ICD-10-CM | POA: Diagnosis not present

## 2016-03-17 DIAGNOSIS — G35 Multiple sclerosis: Secondary | ICD-10-CM | POA: Diagnosis not present

## 2016-04-16 ENCOUNTER — Ambulatory Visit: Payer: Federal, State, Local not specified - PPO

## 2016-04-23 DIAGNOSIS — K080 Exfoliation of teeth due to systemic causes: Secondary | ICD-10-CM | POA: Diagnosis not present

## 2016-04-28 DIAGNOSIS — G35 Multiple sclerosis: Secondary | ICD-10-CM | POA: Diagnosis not present

## 2016-05-05 DIAGNOSIS — Z79899 Other long term (current) drug therapy: Secondary | ICD-10-CM | POA: Diagnosis not present

## 2016-05-05 DIAGNOSIS — G35 Multiple sclerosis: Secondary | ICD-10-CM | POA: Diagnosis not present

## 2016-05-05 DIAGNOSIS — D72819 Decreased white blood cell count, unspecified: Secondary | ICD-10-CM | POA: Diagnosis not present

## 2016-05-05 DIAGNOSIS — F988 Other specified behavioral and emotional disorders with onset usually occurring in childhood and adolescence: Secondary | ICD-10-CM | POA: Diagnosis not present

## 2016-05-12 DIAGNOSIS — G35 Multiple sclerosis: Secondary | ICD-10-CM | POA: Diagnosis not present

## 2016-05-19 ENCOUNTER — Ambulatory Visit
Admission: RE | Admit: 2016-05-19 | Discharge: 2016-05-19 | Disposition: A | Discharge status: Home / Self Care | Payer: Federal, State, Local not specified - PPO | Source: Ambulatory Visit | Attending: Obstetrics and Gynecology | Admitting: Obstetrics and Gynecology

## 2016-05-19 DIAGNOSIS — Z1231 Encounter for screening mammogram for malignant neoplasm of breast: Secondary | ICD-10-CM

## 2016-08-25 DIAGNOSIS — Z79899 Other long term (current) drug therapy: Secondary | ICD-10-CM | POA: Diagnosis not present

## 2016-08-25 DIAGNOSIS — Z23 Encounter for immunization: Secondary | ICD-10-CM | POA: Diagnosis not present

## 2016-08-25 DIAGNOSIS — F900 Attention-deficit hyperactivity disorder, predominantly inattentive type: Secondary | ICD-10-CM | POA: Diagnosis not present

## 2016-08-25 DIAGNOSIS — D72819 Decreased white blood cell count, unspecified: Secondary | ICD-10-CM | POA: Diagnosis not present

## 2016-08-25 DIAGNOSIS — G35 Multiple sclerosis: Secondary | ICD-10-CM | POA: Diagnosis not present

## 2016-10-21 ENCOUNTER — Other Ambulatory Visit (HOSPITAL_COMMUNITY)
Admission: RE | Admit: 2016-10-21 | Discharge: 2016-10-21 | Disposition: A | Discharge status: Home / Self Care | Payer: Federal, State, Local not specified - PPO | Source: Ambulatory Visit | Attending: Obstetrics and Gynecology | Admitting: Obstetrics and Gynecology

## 2016-10-21 ENCOUNTER — Other Ambulatory Visit: Payer: Self-pay | Admitting: Obstetrics and Gynecology

## 2016-10-21 DIAGNOSIS — Z01411 Encounter for gynecological examination (general) (routine) with abnormal findings: Secondary | ICD-10-CM | POA: Diagnosis not present

## 2016-10-21 DIAGNOSIS — Z1151 Encounter for screening for human papillomavirus (HPV): Secondary | ICD-10-CM | POA: Insufficient documentation

## 2016-10-21 DIAGNOSIS — Z01419 Encounter for gynecological examination (general) (routine) without abnormal findings: Secondary | ICD-10-CM | POA: Diagnosis not present

## 2016-10-24 LAB — CYTOLOGY - PAP
Diagnosis: NEGATIVE
HPV: NOT DETECTED

## 2016-11-04 DIAGNOSIS — G35 Multiple sclerosis: Secondary | ICD-10-CM | POA: Diagnosis not present

## 2016-11-19 DIAGNOSIS — D72819 Decreased white blood cell count, unspecified: Secondary | ICD-10-CM | POA: Diagnosis not present

## 2016-11-19 DIAGNOSIS — F988 Other specified behavioral and emotional disorders with onset usually occurring in childhood and adolescence: Secondary | ICD-10-CM | POA: Diagnosis not present

## 2016-11-19 DIAGNOSIS — Z79899 Other long term (current) drug therapy: Secondary | ICD-10-CM | POA: Diagnosis not present

## 2016-11-19 DIAGNOSIS — G35 Multiple sclerosis: Secondary | ICD-10-CM | POA: Diagnosis not present

## 2016-11-20 ENCOUNTER — Other Ambulatory Visit: Payer: Self-pay | Admitting: Psychiatry

## 2016-11-20 DIAGNOSIS — G35 Multiple sclerosis: Secondary | ICD-10-CM

## 2016-11-20 DIAGNOSIS — D72819 Decreased white blood cell count, unspecified: Secondary | ICD-10-CM | POA: Diagnosis not present

## 2016-11-27 DIAGNOSIS — N6341 Unspecified lump in right breast, subareolar: Secondary | ICD-10-CM | POA: Diagnosis not present

## 2016-12-02 ENCOUNTER — Other Ambulatory Visit: Payer: Self-pay | Admitting: Obstetrics and Gynecology

## 2016-12-02 DIAGNOSIS — N63 Unspecified lump in unspecified breast: Secondary | ICD-10-CM

## 2016-12-08 ENCOUNTER — Ambulatory Visit
Admission: RE | Admit: 2016-12-08 | Discharge: 2016-12-08 | Disposition: A | Discharge status: Home / Self Care | Payer: Federal, State, Local not specified - PPO | Source: Ambulatory Visit | Attending: Obstetrics and Gynecology | Admitting: Obstetrics and Gynecology

## 2016-12-08 DIAGNOSIS — N63 Unspecified lump in unspecified breast: Secondary | ICD-10-CM

## 2016-12-08 DIAGNOSIS — R922 Inconclusive mammogram: Secondary | ICD-10-CM | POA: Diagnosis not present

## 2016-12-08 DIAGNOSIS — N6489 Other specified disorders of breast: Secondary | ICD-10-CM | POA: Diagnosis not present

## 2016-12-15 DIAGNOSIS — K080 Exfoliation of teeth due to systemic causes: Secondary | ICD-10-CM | POA: Diagnosis not present

## 2016-12-27 ENCOUNTER — Ambulatory Visit
Admission: RE | Admit: 2016-12-27 | Discharge: 2016-12-27 | Disposition: A | Discharge status: Home / Self Care | Payer: Federal, State, Local not specified - PPO | Source: Ambulatory Visit | Attending: Psychiatry | Admitting: Psychiatry

## 2016-12-27 DIAGNOSIS — G35 Multiple sclerosis: Secondary | ICD-10-CM | POA: Diagnosis not present

## 2016-12-27 DIAGNOSIS — G35D Multiple sclerosis, unspecified: Secondary | ICD-10-CM

## 2016-12-27 MED ORDER — GADOBENATE DIMEGLUMINE 529 MG/ML IV SOLN
13.0000 mL | Freq: Once | INTRAVENOUS | Status: AC | PRN
  Administered 2016-12-27: 13 mL via INTRAVENOUS

## 2017-01-22 DIAGNOSIS — K080 Exfoliation of teeth due to systemic causes: Secondary | ICD-10-CM | POA: Diagnosis not present

## 2017-02-23 DIAGNOSIS — D72819 Decreased white blood cell count, unspecified: Secondary | ICD-10-CM | POA: Diagnosis not present

## 2017-02-23 DIAGNOSIS — G35 Multiple sclerosis: Secondary | ICD-10-CM | POA: Diagnosis not present

## 2017-02-23 DIAGNOSIS — F988 Other specified behavioral and emotional disorders with onset usually occurring in childhood and adolescence: Secondary | ICD-10-CM | POA: Diagnosis not present

## 2017-02-23 DIAGNOSIS — Z79899 Other long term (current) drug therapy: Secondary | ICD-10-CM | POA: Diagnosis not present

## 2017-02-24 DIAGNOSIS — D72819 Decreased white blood cell count, unspecified: Secondary | ICD-10-CM | POA: Diagnosis not present

## 2017-05-04 DIAGNOSIS — G35 Multiple sclerosis: Secondary | ICD-10-CM | POA: Diagnosis not present

## 2017-05-25 DIAGNOSIS — Z79899 Other long term (current) drug therapy: Secondary | ICD-10-CM | POA: Diagnosis not present

## 2017-05-25 DIAGNOSIS — G35 Multiple sclerosis: Secondary | ICD-10-CM | POA: Diagnosis not present

## 2017-05-25 DIAGNOSIS — D72819 Decreased white blood cell count, unspecified: Secondary | ICD-10-CM | POA: Diagnosis not present

## 2017-05-25 DIAGNOSIS — F988 Other specified behavioral and emotional disorders with onset usually occurring in childhood and adolescence: Secondary | ICD-10-CM | POA: Diagnosis not present

## 2017-06-25 DIAGNOSIS — K080 Exfoliation of teeth due to systemic causes: Secondary | ICD-10-CM | POA: Diagnosis not present

## 2017-08-11 DIAGNOSIS — Z23 Encounter for immunization: Secondary | ICD-10-CM | POA: Diagnosis not present

## 2017-09-07 DIAGNOSIS — G35 Multiple sclerosis: Secondary | ICD-10-CM | POA: Diagnosis not present

## 2017-09-07 DIAGNOSIS — Z79899 Other long term (current) drug therapy: Secondary | ICD-10-CM | POA: Diagnosis not present

## 2017-09-07 DIAGNOSIS — D72819 Decreased white blood cell count, unspecified: Secondary | ICD-10-CM | POA: Diagnosis not present

## 2017-09-07 DIAGNOSIS — F988 Other specified behavioral and emotional disorders with onset usually occurring in childhood and adolescence: Secondary | ICD-10-CM | POA: Diagnosis not present

## 2017-11-03 DIAGNOSIS — G35 Multiple sclerosis: Secondary | ICD-10-CM | POA: Diagnosis not present

## 2017-11-16 ENCOUNTER — Other Ambulatory Visit: Payer: Self-pay | Admitting: Obstetrics and Gynecology

## 2017-11-16 DIAGNOSIS — Z1231 Encounter for screening mammogram for malignant neoplasm of breast: Secondary | ICD-10-CM

## 2017-11-18 ENCOUNTER — Ambulatory Visit
Admission: RE | Admit: 2017-11-18 | Discharge: 2017-11-18 | Disposition: A | Discharge status: Home / Self Care | Payer: Federal, State, Local not specified - PPO | Source: Ambulatory Visit | Attending: Obstetrics and Gynecology | Admitting: Obstetrics and Gynecology

## 2017-11-18 DIAGNOSIS — Z1231 Encounter for screening mammogram for malignant neoplasm of breast: Secondary | ICD-10-CM

## 2018-01-04 DIAGNOSIS — K080 Exfoliation of teeth due to systemic causes: Secondary | ICD-10-CM | POA: Diagnosis not present

## 2018-01-07 ENCOUNTER — Other Ambulatory Visit: Payer: Self-pay | Admitting: Nurse Practitioner

## 2018-01-07 ENCOUNTER — Other Ambulatory Visit (HOSPITAL_COMMUNITY)
Admission: RE | Admit: 2018-01-07 | Discharge: 2018-01-07 | Disposition: A | Discharge status: Home / Self Care | Payer: Federal, State, Local not specified - PPO | Source: Ambulatory Visit | Attending: Nurse Practitioner | Admitting: Nurse Practitioner

## 2018-01-07 DIAGNOSIS — Z01419 Encounter for gynecological examination (general) (routine) without abnormal findings: Secondary | ICD-10-CM | POA: Diagnosis not present

## 2018-01-11 LAB — CYTOLOGY - PAP
Diagnosis: NEGATIVE
HPV: NOT DETECTED

## 2018-02-24 ENCOUNTER — Other Ambulatory Visit: Payer: Self-pay | Admitting: Psychiatry

## 2018-02-24 DIAGNOSIS — G35 Multiple sclerosis: Secondary | ICD-10-CM

## 2018-03-24 DIAGNOSIS — D72819 Decreased white blood cell count, unspecified: Secondary | ICD-10-CM | POA: Diagnosis not present

## 2018-03-24 DIAGNOSIS — G35 Multiple sclerosis: Secondary | ICD-10-CM | POA: Diagnosis not present

## 2018-03-24 DIAGNOSIS — Z79899 Other long term (current) drug therapy: Secondary | ICD-10-CM | POA: Diagnosis not present

## 2018-03-24 DIAGNOSIS — F988 Other specified behavioral and emotional disorders with onset usually occurring in childhood and adolescence: Secondary | ICD-10-CM | POA: Diagnosis not present

## 2018-03-29 ENCOUNTER — Other Ambulatory Visit: Payer: Federal, State, Local not specified - PPO

## 2018-05-04 DIAGNOSIS — G35 Multiple sclerosis: Secondary | ICD-10-CM | POA: Diagnosis not present

## 2018-05-20 ENCOUNTER — Ambulatory Visit
Admission: RE | Admit: 2018-05-20 | Discharge: 2018-05-20 | Disposition: A | Discharge status: Home / Self Care | Payer: Federal, State, Local not specified - PPO | Source: Ambulatory Visit | Attending: Psychiatry | Admitting: Psychiatry

## 2018-05-20 DIAGNOSIS — G35 Multiple sclerosis: Secondary | ICD-10-CM | POA: Diagnosis not present

## 2018-05-20 MED ORDER — GADOBENATE DIMEGLUMINE 529 MG/ML IV SOLN
12.0000 mL | Freq: Once | INTRAVENOUS | Status: AC | PRN
  Administered 2018-05-20: 12 mL via INTRAVENOUS

## 2018-06-09 DIAGNOSIS — K080 Exfoliation of teeth due to systemic causes: Secondary | ICD-10-CM | POA: Diagnosis not present

## 2018-06-30 DIAGNOSIS — Z23 Encounter for immunization: Secondary | ICD-10-CM | POA: Diagnosis not present

## 2018-08-18 DIAGNOSIS — G35 Multiple sclerosis: Secondary | ICD-10-CM | POA: Diagnosis not present

## 2018-11-03 DIAGNOSIS — G35 Multiple sclerosis: Secondary | ICD-10-CM | POA: Diagnosis not present

## 2018-11-03 DIAGNOSIS — D72819 Decreased white blood cell count, unspecified: Secondary | ICD-10-CM | POA: Diagnosis not present

## 2018-12-15 DIAGNOSIS — K080 Exfoliation of teeth due to systemic causes: Secondary | ICD-10-CM | POA: Diagnosis not present

## 2019-03-04 ENCOUNTER — Emergency Department: Payer: Federal, State, Local not specified - PPO

## 2019-03-04 ENCOUNTER — Observation Stay
Admission: EM | Admit: 2019-03-04 | Discharge: 2019-03-07 | Disposition: A | Discharge status: Home / Self Care | Payer: Federal, State, Local not specified - PPO | Attending: Internal Medicine | Admitting: Internal Medicine

## 2019-03-04 ENCOUNTER — Other Ambulatory Visit: Payer: Self-pay

## 2019-03-04 DIAGNOSIS — Z1159 Encounter for screening for other viral diseases: Secondary | ICD-10-CM | POA: Insufficient documentation

## 2019-03-04 DIAGNOSIS — S82145A Nondisplaced bicondylar fracture of left tibia, initial encounter for closed fracture: Secondary | ICD-10-CM | POA: Insufficient documentation

## 2019-03-04 DIAGNOSIS — W109XXA Fall (on) (from) unspecified stairs and steps, initial encounter: Secondary | ICD-10-CM | POA: Diagnosis not present

## 2019-03-04 DIAGNOSIS — S82142A Displaced bicondylar fracture of left tibia, initial encounter for closed fracture: Secondary | ICD-10-CM | POA: Diagnosis not present

## 2019-03-04 DIAGNOSIS — N390 Urinary tract infection, site not specified: Secondary | ICD-10-CM | POA: Insufficient documentation

## 2019-03-04 DIAGNOSIS — Z79899 Other long term (current) drug therapy: Secondary | ICD-10-CM | POA: Diagnosis not present

## 2019-03-04 DIAGNOSIS — G35 Multiple sclerosis: Secondary | ICD-10-CM | POA: Diagnosis not present

## 2019-03-04 DIAGNOSIS — S82209A Unspecified fracture of shaft of unspecified tibia, initial encounter for closed fracture: Secondary | ICD-10-CM | POA: Diagnosis present

## 2019-03-04 DIAGNOSIS — D72829 Elevated white blood cell count, unspecified: Secondary | ICD-10-CM | POA: Diagnosis not present

## 2019-03-04 DIAGNOSIS — Z87891 Personal history of nicotine dependence: Secondary | ICD-10-CM | POA: Diagnosis not present

## 2019-03-04 DIAGNOSIS — F418 Other specified anxiety disorders: Secondary | ICD-10-CM | POA: Diagnosis not present

## 2019-03-04 DIAGNOSIS — S82102A Unspecified fracture of upper end of left tibia, initial encounter for closed fracture: Secondary | ICD-10-CM | POA: Diagnosis not present

## 2019-03-04 DIAGNOSIS — S82202A Unspecified fracture of shaft of left tibia, initial encounter for closed fracture: Secondary | ICD-10-CM | POA: Diagnosis present

## 2019-03-04 DIAGNOSIS — Z01818 Encounter for other preprocedural examination: Secondary | ICD-10-CM | POA: Diagnosis not present

## 2019-03-04 DIAGNOSIS — Z793 Long term (current) use of hormonal contraceptives: Secondary | ICD-10-CM | POA: Insufficient documentation

## 2019-03-04 DIAGNOSIS — Z03818 Encounter for observation for suspected exposure to other biological agents ruled out: Secondary | ICD-10-CM | POA: Diagnosis not present

## 2019-03-04 DIAGNOSIS — W19XXXA Unspecified fall, initial encounter: Secondary | ICD-10-CM | POA: Diagnosis not present

## 2019-03-04 LAB — CBC WITH DIFFERENTIAL/PLATELET
Abs Immature Granulocytes: 0.06 10*3/uL (ref 0.00–0.07)
Basophils Absolute: 0.1 10*3/uL (ref 0.0–0.1)
Basophils Relative: 0 %
Eosinophils Absolute: 0 10*3/uL (ref 0.0–0.5)
Eosinophils Relative: 0 %
HCT: 42.2 % (ref 36.0–46.0)
Hemoglobin: 14.2 g/dL (ref 12.0–15.0)
Immature Granulocytes: 0 %
Lymphocytes Relative: 5 %
Lymphs Abs: 1 10*3/uL (ref 0.7–4.0)
MCH: 31.2 pg (ref 26.0–34.0)
MCHC: 33.6 g/dL (ref 30.0–36.0)
MCV: 92.7 fL (ref 80.0–100.0)
Monocytes Absolute: 1.4 10*3/uL — ABNORMAL HIGH (ref 0.1–1.0)
Monocytes Relative: 7 %
Neutro Abs: 16.5 10*3/uL — ABNORMAL HIGH (ref 1.7–7.7)
Neutrophils Relative %: 88 %
Platelets: 244 10*3/uL (ref 150–400)
RBC: 4.55 MIL/uL (ref 3.87–5.11)
RDW: 12 % (ref 11.5–15.5)
WBC: 18.9 10*3/uL — ABNORMAL HIGH (ref 4.0–10.5)
nRBC: 0 % (ref 0.0–0.2)

## 2019-03-04 LAB — BASIC METABOLIC PANEL
Anion gap: 10 (ref 5–15)
BUN: 10 mg/dL (ref 6–20)
CO2: 24 mmol/L (ref 22–32)
Calcium: 8.5 mg/dL — ABNORMAL LOW (ref 8.9–10.3)
Chloride: 102 mmol/L (ref 98–111)
Creatinine, Ser: 0.63 mg/dL (ref 0.44–1.00)
GFR calc Af Amer: >60 mL/min (ref >60)
GFR calc non Af Amer: >60 mL/min (ref >60)
Glucose, Bld: 111 mg/dL — ABNORMAL HIGH (ref 70–99)
Potassium: 4 mmol/L (ref 3.5–5.1)
Sodium: 136 mmol/L (ref 135–145)

## 2019-03-04 LAB — SARS CORONAVIRUS 2 BY RT PCR (HOSPITAL ORDER, PERFORMED IN ~~LOC~~ HOSPITAL LAB): SARS Coronavirus 2: NEGATIVE

## 2019-03-04 LAB — URINALYSIS, ROUTINE W REFLEX MICROSCOPIC
Bilirubin Urine: NEGATIVE
Glucose, UA: NEGATIVE mg/dL
Ketones, ur: 20 mg/dL — AB
Leukocytes,Ua: NEGATIVE
Nitrite: POSITIVE — AB
Protein, ur: 30 mg/dL — AB
Specific Gravity, Urine: 1.014 (ref 1.005–1.030)
pH: 6 (ref 5.0–8.0)

## 2019-03-04 LAB — TYPE AND SCREEN
ABO/RH(D): AB NEG
Antibody Screen: NEGATIVE

## 2019-03-04 LAB — SURGICAL PCR SCREEN
MRSA, PCR: NEGATIVE
Staphylococcus aureus: NEGATIVE

## 2019-03-04 LAB — PROTIME-INR
INR: 1.1 (ref 0.8–1.2)
Prothrombin Time: 13.6 seconds (ref 11.4–15.2)

## 2019-03-04 MED ORDER — CYANOCOBALAMIN 1000 MCG/ML IJ SOLN
1000.0000 ug | INTRAMUSCULAR | Status: DC
  Filled 2019-03-04: qty 1

## 2019-03-04 MED ORDER — AMPHETAMINE-DEXTROAMPHETAMINE 5 MG PO TABS
20.0000 mg | ORAL_TABLET | Freq: Two times a day (BID) | ORAL | Status: DC
  Administered 2019-03-06: 08:00:00 10 mg via ORAL
  Administered 2019-03-07: 09:00:00 20 mg via ORAL
  Filled 2019-03-04 (×4): qty 4

## 2019-03-04 MED ORDER — NORETHIN ACE-ETH ESTRAD-FE 1-20 MG-MCG PO TABS: 1.0000 | ORAL_TABLET | Freq: Every day | ORAL | Status: DC

## 2019-03-04 MED ORDER — HYDROCODONE-ACETAMINOPHEN 5-325 MG PO TABS
1.0000 | ORAL_TABLET | ORAL | Status: DC | PRN
  Administered 2019-03-04 – 2019-03-06 (×3): 1 via ORAL
  Filled 2019-03-04 (×3): qty 1

## 2019-03-04 MED ORDER — MORPHINE SULFATE (PF) 2 MG/ML IV SOLN
2.0000 mg | INTRAVENOUS | Status: DC | PRN
  Administered 2019-03-05 (×3): 2 mg via INTRAVENOUS
  Filled 2019-03-04 (×3): qty 1

## 2019-03-04 MED ORDER — CEFAZOLIN SODIUM-DEXTROSE 1-4 GM/50ML-% IV SOLN
1.0000 g | INTRAVENOUS | Status: AC
  Administered 2019-03-05: 1 g via INTRAVENOUS
  Filled 2019-03-04 (×2): qty 50

## 2019-03-04 MED ORDER — SODIUM CHLORIDE 0.9 % IV SOLN
INTRAVENOUS | Status: DC
  Administered 2019-03-04 – 2019-03-05 (×2): via INTRAVENOUS

## 2019-03-04 MED ORDER — SODIUM CHLORIDE 0.9 % IV SOLN
1.0000 g | INTRAVENOUS | Status: DC
  Administered 2019-03-05 (×2): 1 g via INTRAVENOUS
  Filled 2019-03-04 (×2): qty 10
  Filled 2019-03-04: qty 1

## 2019-03-04 MED ORDER — GABAPENTIN 600 MG PO TABS: 600.0000 mg | ORAL_TABLET | Freq: Four times a day (QID) | ORAL | Status: DC | PRN

## 2019-03-04 MED ORDER — MORPHINE SULFATE (PF) 2 MG/ML IV SOLN
1.0000 mg | INTRAVENOUS | Status: DC | PRN
  Administered 2019-03-04: 1 mg via INTRAVENOUS
  Filled 2019-03-04 (×2): qty 1

## 2019-03-04 MED ORDER — CLINDAMYCIN PHOSPHATE 600 MG/50ML IV SOLN
600.0000 mg | INTRAVENOUS | Status: AC
  Administered 2019-03-05: 600 mg via INTRAVENOUS
  Filled 2019-03-04 (×2): qty 50

## 2019-03-04 MED ORDER — MELATONIN 5 MG PO TABS
5.0000 mg | ORAL_TABLET | Freq: Every day | ORAL | Status: DC
  Administered 2019-03-05 – 2019-03-06 (×2): 5 mg via ORAL
  Filled 2019-03-04 (×3): qty 1

## 2019-03-04 MED ORDER — CLONAZEPAM 1 MG PO TABS
1.0000 mg | ORAL_TABLET | Freq: Three times a day (TID) | ORAL | Status: DC | PRN
  Filled 2019-03-04: qty 1

## 2019-03-04 MED ORDER — ONDANSETRON 4 MG PO TBDP
4.0000 mg | ORAL_TABLET | Freq: Once | ORAL | Status: AC
  Administered 2019-03-04: 4 mg via ORAL
  Filled 2019-03-04: qty 1

## 2019-03-04 MED ORDER — VITAMIN D (ERGOCALCIFEROL) 1.25 MG (50000 UNIT) PO CAPS
50000.0000 [IU] | ORAL_CAPSULE | ORAL | Status: DC
  Filled 2019-03-04: qty 1

## 2019-03-04 MED ORDER — CEFTRIAXONE SODIUM 1 G IJ SOLR: 1.0000 g | INTRAMUSCULAR | Status: DC

## 2019-03-04 MED ORDER — OXYCODONE HCL 5 MG PO TABS
5.0000 mg | ORAL_TABLET | Freq: Once | ORAL | Status: AC
  Administered 2019-03-04: 5 mg via ORAL
  Filled 2019-03-04: qty 1

## 2019-03-04 MED ORDER — L-LYSINE 1000 MG PO TABS: 1.0000 | ORAL_TABLET | Freq: Every day | ORAL | Status: DC

## 2019-03-04 NOTE — ED Notes (Signed)
See triage note  Presents s/p fall   Pain and swelling noted to left knee

## 2019-03-04 NOTE — ED Notes (Signed)
Pt given ginger ale and warm blanket. Called dietary to bring up meal trays.

## 2019-03-04 NOTE — ED Provider Notes (Signed)
[facility] Emergency Department Provider Note ____________________________________________  Time seen: Approximately 3:33 PM  I have reviewed the triage vital signs and the nursing notes.   HISTORY  Chief Complaint Fall    HPI Deborah Collins is a 48 y.o. female who presents to the emergency department for evaluation and treatment of left knee pain.  Patient was walking down the steps and her left foot was planted on the step but her right foot slipped causing her body to twist. She states that she heard something crack. She landed on her buttocks.  A neighbor helped her into the house.  She states that she rested for a while, but the knee became increasingly painful and swollen and she decided she had to come to the ER.  Past Medical History:  Diagnosis Date  . Anxiety   . Depression   . Neuromuscular disorder [facility])    multiple sclerosis    Patient Active Problem List   Diagnosis Date Noted  . CIS (carcinoma in situ of cervix) 12/21/2015    Past Surgical History:  Procedure Laterality Date  . CERVICAL CONIZATION W/BX N/A 12/21/2015   Procedure: CONIZATION CERVIX WITH BIOPSY;  Surgeon: Geryl Rankins, MD;  Location: [facility];  Service: Gynecology;  Laterality: N/A;  . WISDOM TOOTH EXTRACTION      Prior to Admission medications   Medication Sig Start Date End Date Taking? Authorizing Provider  amphetamine-dextroamphetamine (ADDERALL) 20 MG tablet Take 20 mg by mouth 2 (two) times daily.   Yes [provider]  clonazePAM (KLONOPIN) 1 MG tablet Take 1 mg by mouth 2 (two) times daily as needed for anxiety.   Yes [provider]  gabapentin (NEURONTIN) 300 MG capsule Take 1 capsule by mouth 4 (four) times daily as needed (For pain.).  10/27/15  Yes [provider]  L-Lysine 1000 MG TABS Take 1 tablet by mouth daily.   Yes [provider]  Melatonin 5 MG TABS Take 5 mg by mouth at bedtime.    Yes [provider]   MICROGESTIN FE 1/20 1-20 MG-MCG tablet Take 1 tablet by mouth daily. 10/18/15  Yes [provider]  ocrelizumab (OCREVUS) 300 MG/10ML injection Inject into the vein once.   Yes [provider]    Allergies Sulfa antibiotics  No family history on file.  Social History Social History   Tobacco Use  . Smoking status: Former Games developer  . Smokeless tobacco: Never Used  Substance Use Topics  . Alcohol use: Yes    Comment: occasional  . Drug use: No    Review of Systems Constitutional: Negative for fever. Cardiovascular: Negative for chest pain. Respiratory: Negative for shortness of breath. Musculoskeletal: Positive for left knee pain. Skin: Negative for open wound or lesion. Positive for left knee swelling.  Neurological: Positive for decrease in sensation  ____________________________________________   PHYSICAL EXAM:  VITAL SIGNS: ED Triage Vitals  Enc Vitals Group     BP 03/04/19 1351 109/71     Pulse Rate 03/04/19 1351 89     Resp 03/04/19 1351 17     Temp 03/04/19 1351 97.9 F (36.6 C)     Temp Source 03/04/19 1350 Oral     SpO2 03/04/19 1351 100 %     Weight 03/04/19 1351 140 lb (63.5 kg)     Height 03/04/19 1351  (1.753 m)     Head Circumference --      Peak Flow --      Pain Score 03/04/19  1350 10     Pain Loc --      Pain Edu? --      Excl. in GC? --     Constitutional: Alert and oriented. Well appearing and in no acute distress. Eyes: Conjunctivae are clear without discharge or drainage Head: Atraumatic Neck: Supple Respiratory: No cough. Respirations are even and unlabored. Musculoskeletal: Deformity of the left knee is present.  Range of motion of the left hip and ankle demonstrated.  Patella appears to be in place. Neurologic: Decreased sensation in the left lower extremity on exam.  Still able to determine sharp and dull.  Motor function intact of the left foot and toes. Skin: Diffuse edema of the left knee including the  popliteal surface.   Psychiatric: Affect and behavior are appropriate.  ____________________________________________   LABS (all labs ordered are listed, but only abnormal results are displayed)  Labs Reviewed  SARS CORONAVIRUS 2 (HOSPITAL ORDER, PERFORMED IN [facility])  BASIC METABOLIC PANEL  CBC WITH DIFFERENTIAL/PLATELET   ____________________________________________  RADIOLOGY  Comminuted intra-articular fracture of the proximal tibia with depression involving the lateral tibial plateau.  There is extension of some nondisplaced components into the medial tibial plateau and proximal metadiaphysis.  Hemarthrosis is noted with soft tissue edema in the popliteal fossa. ____________________________________________   PROCEDURES  .Ortho Injury Treatment Date/Time: 03/04/2019 5:50 PM Performed by: Fran Lowes, NT Authorized by: Chinita Pester, FNP   Consent:    Consent obtained:  Verbal   Consent given by:  PatientInjury location: knee Location details: left knee Injury type: fracture Fracture type: tibial plateau Pre-procedure distal perfusion: normal Pre-procedure neurological function: diminished Pre-procedure range of motion: reduced Manipulation performed: no Splint type: Knee immobilizer. Supplies used: elastic bandage Post-procedure distal perfusion: normal Post-procedure neurological function: diminished Post-procedure range of motion: unchanged Patient tolerance: Patient tolerated the procedure well with no immediate complications     ____________________________________________   INITIAL IMPRESSION / ASSESSMENT AND PLAN / ED COURSE  Deborah Collins is a 48 y.o. who presents to the emergency department for treatment and evaluation after mechanical, non-syncopal fall at home this afternoon.  Deformity of the knee is apparent on visual exam.  Range of motion of the knee not attempted.  Will obtain x-rays.  Nausea and pain medication to  be given.  Patient denies pain in other areas.  She states that when she fell she landed on her bottom.  She denies back pain.  Patient does have multiple sclerosis and states that her left leg is typically the stronger of the 2.  She states that her right leg is typically weaker. ----------------------------------------- 3:47 PM on 03/04/2019 -----------------------------------------  See x-ray results.  Dr. Hyacinth Meeker paged for consult.  ----------------------------------------- 5:30 PM on 03/04/2019 -----------------------------------------  Dr. Hyacinth Meeker reviewed films.  CT of the knee to be completed and the patient is to be admitted through the hospitalist service. Dr. Enid Baas requests that labs be completed prior to accepting her for admission.    Medications  ondansetron (ZOFRAN-ODT) disintegrating tablet 4 mg (4 mg Oral Given 03/04/19 1603)  oxyCODONE (Oxy IR/ROXICODONE) immediate release tablet 5 mg (5 mg Oral Given 03/04/19 1603)    Pertinent labs & imaging results that were available during my care of the patient were reviewed by me and considered in my medical decision making (see chart for details).  _________________________________________   FINAL CLINICAL IMPRESSION(S) / ED DIAGNOSES     Final diagnoses:  Tibial plateau fracture, left, closed, initial encounter  Closed  fracture of proximal end of left tibia, unspecified fracture morphology, initial encounter    ED Discharge Orders    None       If controlled substance prescribed during this visit, 12 month history viewed on the [facility] prior to issuing an initial prescription for Schedule II or III opiod.   Chinita Pester, FNP 03/04/19 2325    Willy Eddy, MD 03/04/19 [phone]

## 2019-03-04 NOTE — H&P (Signed)
[facility] [place] [facility]   PATIENT NAME: Deborah Collins    MR#:  [id number]  DATE OF BIRTH:  Jul 17, 1971  DATE OF ADMISSION:  03/04/2019  PRIMARY CARE PHYSICIAN: Christoper AllegraJeffery, Douglas R., MD   REQUESTING/REFERRING PHYSICIAN: Kem Boroughsriplett, Cari  CHIEF COMPLAINT:   Chief Complaint  Patient presents with   Fall    HISTORY OF PRESENT ILLNESS:  Deborah CrapeMelodee Kinoshita  is a 48 y.o. female with a known history of multiple sclerosis, anxiety and depression who presented to the emergency room following mechanical fall resulting in left lower extremity pain.  Patient sleeps and fell.  Patient did not hit her head.  No loss of consciousness patient was evaluated in the emergency room and had CT scan of the left knee done which revealed comminuted intra-articular fracture of the proximal tibia.  There is extension of nondisplaced component into the medial tibial plateau and proximal metadiaphysis.  Laboratory studies done significant for leukocytosis with white blood cell count of 18,000.  Urinalysis with evidence of UTI patient already seen by tabetic physician physician Dr. Hyacinth MeekerMiller with plans for surgery in a.m.  PAST MEDICAL HISTORY:   Past Medical History:  Diagnosis Date   Anxiety    Depression    Neuromuscular disorder (HCC)    multiple sclerosis    PAST SURGICAL HISTORY:   Past Surgical History:  Procedure Laterality Date   CERVICAL CONIZATION W/BX N/A 12/21/2015   Procedure: CONIZATION CERVIX WITH BIOPSY;  Surgeon: Geryl RankinsEvelyn Varnado, MD;  Location: [facility];  Service: Gynecology;  Laterality: N/A;   WISDOM TOOTH EXTRACTION      SOCIAL HISTORY:   Social History   Tobacco Use   Smoking status: Former Smoker   Smokeless tobacco: Never Used  Substance Use Topics   Alcohol use: Yes    Comment: occasional    FAMILY HISTORY:  No family history on file.  DRUG ALLERGIES:   Allergies  Allergen Reactions   Sulfa Antibiotics Hives and Itching     REVIEW OF SYSTEMS:   Review of Systems  Constitutional: Negative for chills and fever.  HENT: Negative for hearing loss and tinnitus.   Eyes: Negative for blurred vision and double vision.  Respiratory: Negative for cough and hemoptysis.   Cardiovascular: Negative for chest pain and palpitations.  Gastrointestinal: Negative for heartburn and nausea.  Genitourinary: Negative for dysuria and urgency.  Musculoskeletal: Negative for myalgias and neck pain.       Mechanical fall at home  Skin: Negative for rash.  Neurological: Negative for dizziness and headaches.  Psychiatric/Behavioral: Negative for depression and hallucinations.    MEDICATIONS AT HOME:   Prior to Admission medications   Medication Sig Start Date End Date Taking? Authorizing Provider  amphetamine-dextroamphetamine (ADDERALL) 20 MG tablet Take 20 mg by mouth 2 (two) times daily.   Yes [provider]  clonazePAM (KLONOPIN) 1 MG tablet Take 1 mg by mouth 3 (three) times daily as needed for anxiety.    Yes [provider]  cyanocobalamin (,VITAMIN B-12,) 1000 MCG/ML injection Inject 1,000 mcg into the muscle once a week. 02/24/19  Yes [provider]  gabapentin (NEURONTIN) 600 MG tablet Take 600 mg by mouth 4 (four) times daily as needed for pain. 02/24/19  Yes [provider]  L-Lysine 1000 MG TABS Take 1 tablet by mouth daily.   Yes [provider]  Melatonin 5 MG TABS Take 5 mg by mouth at bedtime.    Yes [provider]  Mayer CamelMICROGESTIN  FE 1/20 1-20 MG-MCG tablet Take 1 tablet by mouth daily.   Yes [provider]  ocrelizumab (OCREVUS) 300 MG/10ML injection Inject into the vein every 6 (six) months.    Yes [provider]  Vitamin D, Ergocalciferol, (DRISDOL) 1.25 MG (50000 UT) CAPS capsule Take 50,000 Units by mouth once a week. 02/24/19  Yes [provider]      VITAL SIGNS:  Blood pressure 112/72, pulse 78, temperature 97.9 F (36.6  C), temperature source Oral, resp. rate 16, height  (1.753 m), weight 63.5 kg, last menstrual period 03/02/2019, SpO2 99 %.  PHYSICAL EXAMINATION:  Physical Exam  GENERAL:  48 y.o.-year-old patient lying in the bed with no acute distress.  EYES: Pupils equal, round, reactive to light and accommodation. No scleral icterus. Extraocular muscles intact.  HEENT: Head atraumatic, normocephalic. Oropharynx and nasopharynx clear.  NECK:  Supple, no jugular venous distention. No thyroid enlargement, no tenderness.  LUNGS: Normal breath sounds bilaterally, no wheezing, rales,rhonchi or crepitation. No use of accessory muscles of respiration.  CARDIOVASCULAR: S1, S2 normal. No murmurs, rubs, or gallops.  ABDOMEN: Soft, nontender, nondistended. Bowel sounds present. No organomegaly or mass.  EXTREMITIES: Limitation of movement of left lower extremity secondary to pain from recent fracture of the tibia.  No edema NEUROLOGIC: Cranial nerves II through XII are intact.  Limited movement of left lower extremity secondary to fracture.  Strength of 5/5 in all extremities.   Gait not checked.  PSYCHIATRIC: The patient is alert and oriented x 3.  SKIN: No obvious rash, lesion, or ulcer.   LABORATORY PANEL:   CBC Recent Labs  Lab 03/04/19 1805  WBC 18.9*  HGB 14.2  HCT 42.2  PLT 244   ------------------------------------------------------------------------------------------------------------------  Chemistries  Recent Labs  Lab 03/04/19 1805  NA 136  K 4.0  CL 102  CO2 24  GLUCOSE 111*  BUN 10  CREATININE 0.63  CALCIUM 8.5*   ------------------------------------------------------------------------------------------------------------------  Cardiac Enzymes No results for input(s): TROPONINI in the last 168 hours. ------------------------------------------------------------------------------------------------------------------  RADIOLOGY:  Dg Chest 2 View  Result Date:  03/04/2019 CLINICAL DATA:  Preoperative evaluation, history of leukocytosis EXAM: CHEST - 2 VIEW COMPARISON:  None. FINDINGS: Cardiac shadows within normal limits. The lungs are well aerated bilaterally. No focal infiltrate or sizable effusion is seen. No bony abnormality is noted. No pneumothorax is noted. IMPRESSION: No acute abnormality noted. Electronically Signed   By: Alcide Clever M.D.   On: 03/04/2019 19:52   Ct Knee Left Wo Contrast  Result Date: 03/04/2019 CLINICAL DATA:  Fall today. Comminuted fracture of the proximal tibia. EXAM: CT OF THE LEFT KNEE WITHOUT CONTRAST TECHNIQUE: Multidetector CT imaging of the left knee was performed according to the standard protocol. Multiplanar CT image reconstructions were also generated. COMPARISON:  Radiographs same date FINDINGS: Bones/Joint/Cartilage There is an acute, extensively comminuted intra-articular fracture of the proximal tibia. There is significant depression of the lateral tibial plateau posteriorly by approximately 1.7 cm. There is posterior displacement of the metaphyseal cortex by 8 mm on sagittal image 74/6. There are nondisplaced fractures extending through the central tibia into the posterior aspect of the medial tibial plateau. Fractures extend into the proximal metadiaphysis. Both tibial spines are undermined. The fibular head appears intact and normally located. The distal femur and patella are intact. There is a large lipohemarthrosis. Ligaments Suboptimally assessed by CT.  The cruciate ligaments appear intact. Muscles and Tendons The extensor mechanism is intact.  No focal muscular hematoma. Soft tissues There  is soft tissue swelling around the knee, especially in the popliteal fossa. Some of this may be secondary to capsular extravasation. No focal extra-articular hematoma demonstrated. IMPRESSION: 1. Comminuted intra-articular fracture of the proximal tibia as described. This fracture has a significantly depressed component involving  the lateral tibial plateau. There is extension of nondisplaced components into the medial tibial plateau and proximal metadiaphysis. 2. The distal femur, patella and proximal fibula appear intact. 3. Large hemarthrosis with soft tissue edema in the popliteal fossa. Electronically Signed   By: Carey Bullocks M.D.   On: 03/04/2019 17:25   Dg Knee Complete 4 Views Left  Result Date: 03/04/2019 CLINICAL DATA:  Initial evaluation for acute left knee pain status post fall. EXAM: LEFT KNEE - COMPLETE 4+ VIEW COMPARISON:  None. FINDINGS: Acute comminuted fracture of the proximal left tibia with extension through the lateral tibial plateau. Associated mild depression and displacement. Associated joint effusion. Soft tissue swelling present about the knee. Distal femur, patella, and fibula intact. IMPRESSION: Acute comminuted fracture of the proximal left tibia with extension through the lateral tibial plateau with mild displacement and depression. Electronically Signed   By: Rise Mu M.D.   On: 03/04/2019 15:35      IMPRESSION AND PLAN:  Patient is a 48 year old female with history of multiple sclerosis and anxiety with depression being admitted for left tibia fracture following mechanical fall.  1.  Mechanical fall with left tibia fracture Orthopedic physician Dr. Hyacinth Meeker already notified.  I discussed with him and he is planning for surgery in a.m and he has ordered his prophylactic antibiotics for surgery 4 AM Pain control.  2.  Urinary tract infection Patient with leukocytosis with white count of 18,000.  No fevers.  Clinically patient does not appear septic. Started empirically on IV antibiotics with Rocephin. Follow-up CBC in a.m.  DVT prophylaxis; SCDs Lovenox to be initiated after surgery  All the records are reviewed and case discussed with ED provider. Management plans discussed with the patient, family and they are in agreement.  CODE STATUS: Full code  TOTAL TIME TAKING  CARE OF THIS PATIENT: 55 minutes.    Amberli Ruegg M.D on 03/04/2019 at 7:57 PM  Between 7am to 6pm - Pager - [phone]  After 6pm go to [email or web address] - Social research officer, government  [facility]  Office  [phone]  CC: Primary care physician; Christoper Allegra., MD   Note: This dictation was prepared with Dragon dictation along with smaller phrase technology. Any transcriptional errors that result from this process are unintentional.

## 2019-03-04 NOTE — ED Triage Notes (Signed)
Pt states she slipped on the wet steps today and is having left knee pain

## 2019-03-04 NOTE — ED Notes (Signed)
Patient transported to X-ray 

## 2019-03-04 NOTE — ED Notes (Addendum)
ED TO INPATIENT HANDOFF REPORT  ED Nurse Name and Phone #: Geraldine Contras [phone]  S Name/Age/Gender Deborah Collins 48 y.o. female Room/Bed: ED45A/ED45A  Code Status   Code Status: Not on file  Home/SNF/Other Home Patient oriented to: self, place, time and situation Is this baseline? Yes   Triage Complete: Triage complete  Chief Complaint Fall/knee pain   Triage Note Pt states she slipped on the wet steps today and is having left knee pain   Allergies Allergies  Allergen Reactions  . Sulfa Antibiotics Hives and Itching    Level of Care/Admitting Diagnosis ED Disposition    ED Disposition Condition Comment   Admit  The patient appears reasonably stabilized for admission considering the current resources, flow, and capabilities available in the ED at this time, and I doubt any other [facility] requiring further screening and/or treatment in the ED prior to admission is  present.       B Medical/Surgery History Past Medical History:  Diagnosis Date  . Anxiety   . Depression   . Neuromuscular disorder (HCC)    multiple sclerosis   Past Surgical History:  Procedure Laterality Date  . CERVICAL CONIZATION W/BX N/A 12/21/2015   Procedure: CONIZATION CERVIX WITH BIOPSY;  Surgeon: Geryl Rankins, MD;  Location: [facility];  Service: Gynecology;  Laterality: N/A;  . WISDOM TOOTH EXTRACTION       A IV Location/Drains/Wounds Patient Lines/Drains/Airways Status   Active Line/Drains/Airways    Name:   Placement date:   Placement time:   Site:   Days:   Peripheral IV 03/04/19 Right Antecubital   03/04/19    1820    Antecubital   less than 1   Incision (Closed) 12/21/15 Perineum Other (Comment)   12/21/15    0940     1169          Intake/Output Last 24 hours No intake or output data in the 24 hours ending 03/04/19 1936  Labs/Imaging Results for orders placed or performed during the hospital encounter of 03/04/19 (from the past 48 hour(s))  SARS Coronavirus 2 (CEPHEID - Performed in  [facility] hospital lab), Hosp Order     Status: None   Collection Time: 03/04/19  4:38 PM  Result Value Ref Range   SARS Coronavirus 2 NEGATIVE NEGATIVE    Comment: (NOTE) If result is NEGATIVE SARS-CoV-2 target nucleic acids are NOT DETECTED. The SARS-CoV-2 RNA is generally detectable in upper and lower  respiratory specimens during the acute phase of infection. The lowest  concentration of SARS-CoV-2 viral copies this assay can detect is 250  copies / mL. A negative result does not preclude SARS-CoV-2 infection  and should not be used as the sole basis for treatment or other  patient management decisions.  A negative result may occur with  improper specimen collection / handling, submission of specimen other  than nasopharyngeal swab, presence of viral mutation(s) within the  areas targeted by this assay, and inadequate number of viral copies  (<250 copies / mL). A negative result must be combined with clinical  observations, patient history, and epidemiological information. If result is POSITIVE SARS-CoV-2 target nucleic acids are DETECTED. The SARS-CoV-2 RNA is generally detectable in upper and lower  respiratory specimens dur ing the acute phase of infection.  Positive  results are indicative of active infection with SARS-CoV-2.  Clinical  correlation with patient history and other diagnostic information is  necessary to determine patient infection status.  Positive results do  not rule out bacterial infection  or co-infection with other viruses. If result is PRESUMPTIVE POSTIVE SARS-CoV-2 nucleic acids MAY BE PRESENT.   A presumptive positive result was obtained on the submitted specimen  and confirmed on repeat testing.  While 2019 novel coronavirus  (SARS-CoV-2) nucleic acids may be present in the submitted sample  additional confirmatory testing may be necessary for epidemiological  and / or clinical management purposes  to differentiate between  SARS-CoV-2 and other  Sarbecovirus currently known to infect humans.  If clinically indicated additional testing with an alternate test  methodology [phone]) is advised. The SARS-CoV-2 RNA is generally  detectable in upper and lower respiratory sp ecimens during the acute  phase of infection. The expected result is Negative. Fact Sheet for Patients:  BoilerBrush.com.cy Fact Sheet for Healthcare Providers: https://pope.com/ This test is not yet approved or cleared by the [place] FDA and has been authorized for detection and/or diagnosis of SARS-CoV-2 by FDA under an Emergency Use Authorization (EUA).  This EUA will remain in effect (meaning this test can be used) for the duration of the COVID-19 declaration under Section 564(b)(1) of the Act, 21 U.S.C. section 360bbb-3(b)(1), unless the authorization is terminated or revoked sooner. Performed at [facility], [address] [place], [place] [address]   Basic metabolic panel     Status: Abnormal   Collection Time: 03/04/19  6:05 PM  Result Value Ref Range   Sodium 136 135 - 145 mmol/L   Potassium 4.0 3.5 - 5.1 mmol/L   Chloride 102 98 - 111 mmol/L   CO2 24 22 - 32 mmol/L   Glucose, Bld 111 (H) 70 - 99 mg/dL   BUN 10 6 - 20 mg/dL   Creatinine, Ser 3.35 0.44 - 1.00 mg/dL   Calcium 8.5 (L) 8.9 - 10.3 mg/dL   GFR calc non Af Amer >60 >60 mL/min   GFR calc Af Amer >60 >60 mL/min   Anion gap 10 5 - 15    Comment: Performed at [facility], [address] [place], [place] [address]  CBC with Differential     Status: Abnormal   Collection Time: 03/04/19  6:05 PM  Result Value Ref Range   WBC 18.9 (H) 4.0 - 10.5 K/uL   RBC 4.55 3.87 - 5.11 MIL/uL   Hemoglobin 14.2 12.0 - 15.0 g/dL   HCT 63.8 93.7 - 34.2 %   MCV 92.7 80.0 - 100.0 fL   MCH 31.2 26.0 - 34.0 pg   MCHC 33.6 30.0 - 36.0 g/dL   RDW 87.6 81.1 - 57.2 %   Platelets 244 150 - 400 K/uL   nRBC 0.0 0.0 - 0.2 %   Neutrophils  Relative % 88 %   Neutro Abs 16.5 (H) 1.7 - 7.7 K/uL   Lymphocytes Relative 5 %   Lymphs Abs 1.0 0.7 - 4.0 K/uL   Monocytes Relative 7 %   Monocytes Absolute 1.4 (H) 0.1 - 1.0 K/uL   Eosinophils Relative 0 %   Eosinophils Absolute 0.0 0.0 - 0.5 K/uL   Basophils Relative 0 %   Basophils Absolute 0.1 0.0 - 0.1 K/uL   Immature Granulocytes 0 %   Abs Immature Granulocytes 0.06 0.00 - 0.07 K/uL    Comment: Performed at [facility], [address] [place], [place] [address]  Protime-INR     Status: None   Collection Time: 03/04/19  6:24 PM  Result Value Ref Range   Prothrombin Time 13.6 11.4 - 15.2 seconds   INR 1.1 0.8 -  1.2    Comment: (NOTE) INR goal varies based on device and disease states. Performed at [facility], [address] [place], [place] [address]   Type and screen     Status: None (Preliminary result)   Collection Time: 03/04/19  6:24 PM  Result Value Ref Range   ABO/RH(D) PENDING    Antibody Screen PENDING    Sample Expiration      03/07/2019,2359 Performed at [facility], [address] [place], [place] [address]   Urinalysis, Routine w reflex microscopic     Status: Abnormal   Collection Time: 03/04/19  6:24 PM  Result Value Ref Range   Color, Urine YELLOW (A) YELLOW   APPearance HAZY (A) CLEAR   Specific Gravity, Urine 1.014 1.005 - 1.030   pH 6.0 5.0 - 8.0   Glucose, UA NEGATIVE NEGATIVE mg/dL   Hgb urine dipstick LARGE (A) NEGATIVE   Bilirubin Urine NEGATIVE NEGATIVE   Ketones, ur 20 (A) NEGATIVE mg/dL   Protein, ur 30 (A) NEGATIVE mg/dL   Nitrite POSITIVE (A) NEGATIVE   Leukocytes,Ua NEGATIVE NEGATIVE   RBC / HPF 0-5 0 - 5 RBC/hpf   WBC, UA 0-5 0 - 5 WBC/hpf   Bacteria, UA FEW (A) NONE SEEN   Squamous Epithelial / LPF 0-5 0 - 5   Mucus PRESENT     Comment: Performed at [facility], [address] [place], [place] [address]   Ct Knee Left Wo Contrast  Result Date: 03/04/2019 CLINICAL DATA:  Fall  today. Comminuted fracture of the proximal tibia. EXAM: CT OF THE LEFT KNEE WITHOUT CONTRAST TECHNIQUE: Multidetector CT imaging of the left knee was performed according to the standard protocol. Multiplanar CT image reconstructions were also generated. COMPARISON:  Radiographs same date FINDINGS: Bones/Joint/Cartilage There is an acute, extensively comminuted intra-articular fracture of the proximal tibia. There is significant depression of the lateral tibial plateau posteriorly by approximately 1.7 cm. There is posterior displacement of the metaphyseal cortex by 8 mm on sagittal image 74/6. There are nondisplaced fractures extending through the central tibia into the posterior aspect of the medial tibial plateau. Fractures extend into the proximal metadiaphysis. Both tibial spines are undermined. The fibular head appears intact and normally located. The distal femur and patella are intact. There is a large lipohemarthrosis. Ligaments Suboptimally assessed by CT.  The cruciate ligaments appear intact. Muscles and Tendons The extensor mechanism is intact.  No focal muscular hematoma. Soft tissues There is soft tissue swelling around the knee, especially in the popliteal fossa. Some of this may be secondary to capsular extravasation. No focal extra-articular hematoma demonstrated. IMPRESSION: 1. Comminuted intra-articular fracture of the proximal tibia as described. This fracture has a significantly depressed component involving the lateral tibial plateau. There is extension of nondisplaced components into the medial tibial plateau and proximal metadiaphysis. 2. The distal femur, patella and proximal fibula appear intact. 3. Large hemarthrosis with soft tissue edema in the popliteal fossa. Electronically Signed   By: Carey Bullocks M.D.   On: 03/04/2019 17:25   Dg Knee Complete 4 Views Left  Result Date: 03/04/2019 CLINICAL DATA:  Initial evaluation for acute left knee pain status post fall. EXAM: LEFT KNEE -  COMPLETE 4+ VIEW COMPARISON:  None. FINDINGS: Acute comminuted fracture of the proximal left tibia with extension through the lateral tibial plateau. Associated mild depression and displacement. Associated joint effusion. Soft tissue swelling present about the knee. Distal femur, patella, and fibula intact. IMPRESSION: Acute comminuted fracture of  the proximal left tibia with extension through the lateral tibial plateau with mild displacement and depression. Electronically Signed   By: Rise MuBenjamin  McClintock M.D.   On: 03/04/2019 15:35    Pending Labs Unresulted Labs (From admission, onward)   None      Vitals/Pain Today's Vitals   03/04/19 1351 03/04/19 1638 03/04/19 1823 03/04/19 1845  BP: 109/71 110/70  112/72  Pulse: 89 80  78  Resp: 17 16  16   Temp: 97.9 F (36.6 C)     TempSrc: Oral     SpO2: 100% 99%  99%  Weight: 63.5 kg     Height: 5\' 9"  (1.753 m)     PainSc:  6  2      Isolation Precautions No active isolations  Medications Medications  0.9 %  sodium chloride infusion ( Intravenous New Bag/Given 03/04/19 1934)  ceFAZolin (ANCEF) IVPB 1 g/50 mL premix (has no administration in time range)  clindamycin (CLEOCIN) IVPB 600 mg (has no administration in time range)  HYDROcodone-acetaminophen (NORCO/VICODIN) 5-325 MG per tablet 1 tablet (has no administration in time range)  morphine 2 MG/ML injection 1 mg (has no administration in time range)  ondansetron (ZOFRAN-ODT) disintegrating tablet 4 mg (4 mg Oral Given 03/04/19 1603)  oxyCODONE (Oxy IR/ROXICODONE) immediate release tablet 5 mg (5 mg Oral Given 03/04/19 1603)    Mobility  Low fall risk   Focused Assessments    R Recommendations: See Admitting Provider Note  Report given to: Rosey Batheresa, RN

## 2019-03-04 NOTE — ED Notes (Signed)
Patient transported to CT 

## 2019-03-05 ENCOUNTER — Encounter: Admission: EM | Disposition: A | Discharge status: Home / Self Care | Payer: Self-pay | Source: Home / Self Care

## 2019-03-05 ENCOUNTER — Inpatient Hospital Stay: Payer: Federal, State, Local not specified - PPO

## 2019-03-05 ENCOUNTER — Inpatient Hospital Stay: Payer: Federal, State, Local not specified - PPO | Admitting: Anesthesiology

## 2019-03-05 DIAGNOSIS — S82101A Unspecified fracture of upper end of right tibia, initial encounter for closed fracture: Secondary | ICD-10-CM | POA: Diagnosis not present

## 2019-03-05 DIAGNOSIS — W19XXXA Unspecified fall, initial encounter: Secondary | ICD-10-CM | POA: Diagnosis not present

## 2019-03-05 DIAGNOSIS — S82122A Displaced fracture of lateral condyle of left tibia, initial encounter for closed fracture: Secondary | ICD-10-CM | POA: Diagnosis not present

## 2019-03-05 DIAGNOSIS — S82109D Unspecified fracture of upper end of unspecified tibia, subsequent encounter for closed fracture with routine healing: Secondary | ICD-10-CM | POA: Diagnosis not present

## 2019-03-05 DIAGNOSIS — S82102A Unspecified fracture of upper end of left tibia, initial encounter for closed fracture: Secondary | ICD-10-CM | POA: Diagnosis not present

## 2019-03-05 DIAGNOSIS — S82142A Displaced bicondylar fracture of left tibia, initial encounter for closed fracture: Secondary | ICD-10-CM | POA: Diagnosis not present

## 2019-03-05 DIAGNOSIS — S82209A Unspecified fracture of shaft of unspecified tibia, initial encounter for closed fracture: Secondary | ICD-10-CM | POA: Diagnosis not present

## 2019-03-05 DIAGNOSIS — N390 Urinary tract infection, site not specified: Secondary | ICD-10-CM | POA: Diagnosis not present

## 2019-03-05 DIAGNOSIS — D72829 Elevated white blood cell count, unspecified: Secondary | ICD-10-CM | POA: Diagnosis not present

## 2019-03-05 HISTORY — PX: ORIF TIBIA PLATEAU: SHX2132

## 2019-03-05 LAB — BASIC METABOLIC PANEL
Anion gap: 6 (ref 5–15)
BUN: 8 mg/dL (ref 6–20)
CO2: 23 mmol/L (ref 22–32)
Calcium: 8.3 mg/dL — ABNORMAL LOW (ref 8.9–10.3)
Chloride: 109 mmol/L (ref 98–111)
Creatinine, Ser: 0.56 mg/dL (ref 0.44–1.00)
GFR calc Af Amer: >60 mL/min (ref >60)
GFR calc non Af Amer: >60 mL/min (ref >60)
Glucose, Bld: 125 mg/dL — ABNORMAL HIGH (ref 70–99)
Potassium: 4.2 mmol/L (ref 3.5–5.1)
Sodium: 138 mmol/L (ref 135–145)

## 2019-03-05 LAB — CBC
HCT: 37.4 % (ref 36.0–46.0)
Hemoglobin: 12.4 g/dL (ref 12.0–15.0)
MCH: 30.7 pg (ref 26.0–34.0)
MCHC: 33.2 g/dL (ref 30.0–36.0)
MCV: 92.6 fL (ref 80.0–100.0)
Platelets: 197 10*3/uL (ref 150–400)
RBC: 4.04 MIL/uL (ref 3.87–5.11)
RDW: 12 % (ref 11.5–15.5)
WBC: 10.4 10*3/uL (ref 4.0–10.5)
nRBC: 0 % (ref 0.0–0.2)

## 2019-03-05 LAB — MAGNESIUM: Magnesium: 2.1 mg/dL (ref 1.7–2.4)

## 2019-03-05 LAB — PREGNANCY, URINE: Preg Test, Ur: NEGATIVE

## 2019-03-05 SURGERY — OPEN REDUCTION INTERNAL FIXATION (ORIF) TIBIAL PLATEAU
Anesthesia: General | Laterality: Left

## 2019-03-05 MED ORDER — METHOCARBAMOL 500 MG PO TABS
500.0000 mg | ORAL_TABLET | Freq: Four times a day (QID) | ORAL | Status: DC | PRN
  Filled 2019-03-05: qty 1

## 2019-03-05 MED ORDER — FENTANYL CITRATE (PF) 100 MCG/2ML IJ SOLN
INTRAMUSCULAR | Status: DC | PRN
  Administered 2019-03-05: 50 ug via INTRAVENOUS
  Administered 2019-03-05 (×3): 25 ug via INTRAVENOUS
  Administered 2019-03-05: 50 ug via INTRAVENOUS
  Administered 2019-03-05 (×2): 25 ug via INTRAVENOUS
  Administered 2019-03-05: 50 ug via INTRAVENOUS
  Administered 2019-03-05: 25 ug via INTRAVENOUS

## 2019-03-05 MED ORDER — ALUM & MAG HYDROXIDE-SIMETH 200-200-20 MG/5ML PO SUSP
30.0000 mL | ORAL | Status: DC | PRN
  Administered 2019-03-06: 10:00:00 30 mL via ORAL
  Filled 2019-03-05: qty 30

## 2019-03-05 MED ORDER — SUGAMMADEX SODIUM 200 MG/2ML IV SOLN
INTRAVENOUS | Status: AC
  Filled 2019-03-05: qty 2

## 2019-03-05 MED ORDER — DEXAMETHASONE SODIUM PHOSPHATE 10 MG/ML IJ SOLN
INTRAMUSCULAR | Status: AC
  Filled 2019-03-05: qty 1

## 2019-03-05 MED ORDER — NEOMYCIN-POLYMYXIN B GU 40-200000 IR SOLN
Status: DC | PRN
  Administered 2019-03-05: 4 mL

## 2019-03-05 MED ORDER — BISACODYL 10 MG RE SUPP: 10.0000 mg | Freq: Every day | RECTAL | Status: DC | PRN

## 2019-03-05 MED ORDER — PROPOFOL 10 MG/ML IV BOLUS
INTRAVENOUS | Status: DC | PRN
  Administered 2019-03-05: 150 mg via INTRAVENOUS
  Administered 2019-03-05: 50 mg via INTRAVENOUS

## 2019-03-05 MED ORDER — MIDAZOLAM HCL 2 MG/2ML IJ SOLN
INTRAMUSCULAR | Status: AC
  Filled 2019-03-05: qty 2

## 2019-03-05 MED ORDER — PHENOL 1.4 % MT LIQD
1.0000 | OROMUCOSAL | Status: DC | PRN
  Filled 2019-03-05: qty 177

## 2019-03-05 MED ORDER — LIDOCAINE HCL (CARDIAC) PF 100 MG/5ML IV SOSY
PREFILLED_SYRINGE | INTRAVENOUS | Status: DC | PRN
  Administered 2019-03-05: 80 mg via INTRAVENOUS

## 2019-03-05 MED ORDER — ONDANSETRON HCL 4 MG/2ML IJ SOLN
INTRAMUSCULAR | Status: AC
  Filled 2019-03-05: qty 2

## 2019-03-05 MED ORDER — MENTHOL 3 MG MT LOZG
1.0000 | LOZENGE | OROMUCOSAL | Status: DC | PRN
  Filled 2019-03-05: qty 9

## 2019-03-05 MED ORDER — FLEET ENEMA 7-19 GM/118ML RE ENEM: 1.0000 | ENEMA | Freq: Once | RECTAL | Status: DC | PRN

## 2019-03-05 MED ORDER — MORPHINE SULFATE (PF) 2 MG/ML IV SOLN: 1.0000 mg | INTRAVENOUS | Status: DC | PRN

## 2019-03-05 MED ORDER — FENTANYL CITRATE (PF) 100 MCG/2ML IJ SOLN
INTRAMUSCULAR | Status: AC
  Filled 2019-03-05: qty 2

## 2019-03-05 MED ORDER — HYDROCODONE-ACETAMINOPHEN 5-325 MG PO TABS: 1.0000 | ORAL_TABLET | ORAL | Status: DC | PRN

## 2019-03-05 MED ORDER — PHENYLEPHRINE HCL (PRESSORS) 10 MG/ML IV SOLN
INTRAVENOUS | Status: DC | PRN
  Administered 2019-03-05: 100 ug via INTRAVENOUS

## 2019-03-05 MED ORDER — BUPIVACAINE HCL 0.5 % IJ SOLN
INTRAMUSCULAR | Status: DC | PRN
  Administered 2019-03-05: 30 mL

## 2019-03-05 MED ORDER — SUGAMMADEX SODIUM 200 MG/2ML IV SOLN
INTRAVENOUS | Status: DC | PRN
  Administered 2019-03-05: 150 mg via INTRAVENOUS

## 2019-03-05 MED ORDER — DEXAMETHASONE SODIUM PHOSPHATE 10 MG/ML IJ SOLN
INTRAMUSCULAR | Status: DC | PRN
  Administered 2019-03-05: 10 mg via INTRAVENOUS

## 2019-03-05 MED ORDER — HYDROCODONE-ACETAMINOPHEN 7.5-325 MG PO TABS: 1.0000 | ORAL_TABLET | ORAL | Status: DC | PRN

## 2019-03-05 MED ORDER — METHOCARBAMOL 1000 MG/10ML IJ SOLN
500.0000 mg | Freq: Four times a day (QID) | INTRAVENOUS | Status: DC | PRN
  Filled 2019-03-05: qty 5

## 2019-03-05 MED ORDER — SODIUM CHLORIDE 0.45 % IV SOLN
INTRAVENOUS | Status: DC
  Administered 2019-03-05 – 2019-03-06 (×2): via INTRAVENOUS

## 2019-03-05 MED ORDER — CLINDAMYCIN PHOSPHATE 600 MG/50ML IV SOLN
600.0000 mg | Freq: Three times a day (TID) | INTRAVENOUS | Status: AC
  Administered 2019-03-05 – 2019-03-06 (×3): 600 mg via INTRAVENOUS
  Filled 2019-03-05 (×4): qty 50

## 2019-03-05 MED ORDER — MIDAZOLAM HCL 2 MG/2ML IJ SOLN
INTRAMUSCULAR | Status: DC | PRN
  Administered 2019-03-05: 2 mg via INTRAVENOUS

## 2019-03-05 MED ORDER — FENTANYL CITRATE (PF) 100 MCG/2ML IJ SOLN: 25.0000 ug | INTRAMUSCULAR | Status: DC | PRN

## 2019-03-05 MED ORDER — ONDANSETRON HCL 4 MG/2ML IJ SOLN
INTRAMUSCULAR | Status: DC | PRN
  Administered 2019-03-05: 4 mg via INTRAVENOUS

## 2019-03-05 MED ORDER — ROCURONIUM BROMIDE 50 MG/5ML IV SOLN
INTRAVENOUS | Status: AC
  Filled 2019-03-05: qty 1

## 2019-03-05 MED ORDER — PROPOFOL 10 MG/ML IV BOLUS
INTRAVENOUS | Status: AC
  Filled 2019-03-05: qty 20

## 2019-03-05 MED ORDER — LIDOCAINE HCL (PF) 2 % IJ SOLN
INTRAMUSCULAR | Status: AC
  Filled 2019-03-05: qty 10

## 2019-03-05 MED ORDER — ONDANSETRON HCL 4 MG/2ML IJ SOLN: 4.0000 mg | Freq: Four times a day (QID) | INTRAMUSCULAR | Status: DC | PRN

## 2019-03-05 MED ORDER — LACTATED RINGERS IV SOLN
INTRAVENOUS | Status: DC | PRN
  Administered 2019-03-05: 12:00:00 via INTRAVENOUS

## 2019-03-05 MED ORDER — NEOMYCIN-POLYMYXIN B GU 40-200000 IR SOLN
Status: AC
  Filled 2019-03-05: qty 20

## 2019-03-05 MED ORDER — TRANEXAMIC ACID-NACL 1000-0.7 MG/100ML-% IV SOLN
1000.0000 mg | Freq: Once | INTRAVENOUS | Status: AC
  Administered 2019-03-05: 1000 mg via INTRAVENOUS
  Filled 2019-03-05: qty 100

## 2019-03-05 MED ORDER — DOCUSATE SODIUM 100 MG PO CAPS
100.0000 mg | ORAL_CAPSULE | Freq: Two times a day (BID) | ORAL | Status: DC
  Administered 2019-03-05 – 2019-03-06 (×3): 100 mg via ORAL
  Filled 2019-03-05 (×4): qty 1

## 2019-03-05 MED ORDER — ONDANSETRON HCL 4 MG PO TABS: 4.0000 mg | ORAL_TABLET | Freq: Four times a day (QID) | ORAL | Status: DC | PRN

## 2019-03-05 MED ORDER — DIPHENHYDRAMINE HCL 12.5 MG/5ML PO ELIX
12.5000 mg | ORAL_SOLUTION | ORAL | Status: DC | PRN
  Filled 2019-03-05: qty 10

## 2019-03-05 MED ORDER — GABAPENTIN 300 MG PO CAPS
300.0000 mg | ORAL_CAPSULE | Freq: Three times a day (TID) | ORAL | Status: DC
  Administered 2019-03-05 – 2019-03-07 (×5): 300 mg via ORAL
  Filled 2019-03-05 (×5): qty 1

## 2019-03-05 MED ORDER — ONDANSETRON HCL 4 MG/2ML IJ SOLN: 4.0000 mg | Freq: Once | INTRAMUSCULAR | Status: DC | PRN

## 2019-03-05 MED ORDER — METOCLOPRAMIDE HCL 5 MG PO TABS: 5.0000 mg | ORAL_TABLET | Freq: Three times a day (TID) | ORAL | Status: DC | PRN

## 2019-03-05 MED ORDER — MAGNESIUM HYDROXIDE 400 MG/5ML PO SUSP
30.0000 mL | Freq: Every day | ORAL | Status: DC | PRN
  Filled 2019-03-05: qty 30

## 2019-03-05 MED ORDER — ACETAMINOPHEN 325 MG PO TABS
325.0000 mg | ORAL_TABLET | Freq: Four times a day (QID) | ORAL | Status: DC | PRN
  Administered 2019-03-06 – 2019-03-07 (×2): 650 mg via ORAL
  Filled 2019-03-05 (×2): qty 2

## 2019-03-05 MED ORDER — ACETAMINOPHEN 10 MG/ML IV SOLN
INTRAVENOUS | Status: AC
  Filled 2019-03-05: qty 100

## 2019-03-05 MED ORDER — METOCLOPRAMIDE HCL 5 MG/ML IJ SOLN: 5.0000 mg | Freq: Three times a day (TID) | INTRAMUSCULAR | Status: DC | PRN

## 2019-03-05 MED ORDER — NAPROXEN 250 MG PO TABS
250.0000 mg | ORAL_TABLET | Freq: Two times a day (BID) | ORAL | Status: DC
  Administered 2019-03-05 – 2019-03-07 (×4): 250 mg via ORAL
  Filled 2019-03-05 (×5): qty 1

## 2019-03-05 MED ORDER — CEFAZOLIN SODIUM-DEXTROSE 2-4 GM/100ML-% IV SOLN
2.0000 g | Freq: Three times a day (TID) | INTRAVENOUS | Status: AC
  Administered 2019-03-06 (×2): 2 g via INTRAVENOUS
  Filled 2019-03-05 (×4): qty 100

## 2019-03-05 MED ORDER — EPHEDRINE SULFATE 50 MG/ML IJ SOLN
INTRAMUSCULAR | Status: DC | PRN
  Administered 2019-03-05: 10 mg via INTRAVENOUS

## 2019-03-05 MED ORDER — ACETAMINOPHEN 10 MG/ML IV SOLN
INTRAVENOUS | Status: DC | PRN
  Administered 2019-03-05: 1000 mg via INTRAVENOUS

## 2019-03-05 MED ORDER — EPHEDRINE SULFATE 50 MG/ML IJ SOLN
INTRAMUSCULAR | Status: AC
  Filled 2019-03-05: qty 1

## 2019-03-05 MED ORDER — ROCURONIUM BROMIDE 100 MG/10ML IV SOLN
INTRAVENOUS | Status: DC | PRN
  Administered 2019-03-05: 20 mg via INTRAVENOUS
  Administered 2019-03-05: 30 mg via INTRAVENOUS

## 2019-03-05 MED ORDER — BUPIVACAINE HCL (PF) 0.5 % IJ SOLN
INTRAMUSCULAR | Status: AC
  Filled 2019-03-05: qty 30

## 2019-03-05 MED ORDER — ENOXAPARIN SODIUM 30 MG/0.3ML ~~LOC~~ SOLN: 30.0000 mg | SUBCUTANEOUS | Status: DC

## 2019-03-05 SURGICAL SUPPLY — 74 items
BIT DRILL 100X2.5XANTM LCK (BIT) IMPLANT
BIT DRILL 2.5X2.75 QC CALB (BIT) ×1 IMPLANT
BIT DRILL CAL (BIT) IMPLANT
BIT DRILL SLEEVE MEASURING (BIT) IMPLANT
BIT DRL 100X2.5XANTM LCK (BIT) ×1
BNDG COHESIVE 4X5 TAN STRL (GAUZE/BANDAGES/DRESSINGS) ×2 IMPLANT
BNDG ESMARK 6X12 TAN STRL LF (GAUZE/BANDAGES/DRESSINGS) ×2 IMPLANT
BONE CANC CHIPS 40CC CAN1/2 (Bone Implant) ×2 IMPLANT
CANISTER SUCT 1200ML W/VALVE (MISCELLANEOUS) ×2 IMPLANT
CHIPS CANC BONE 40CC CAN1/2 (Bone Implant) ×1 IMPLANT
CHLORAPREP W/TINT 26 (MISCELLANEOUS) ×3 IMPLANT
COOLER POLAR GLACIER W/PUMP (MISCELLANEOUS) ×2 IMPLANT
COVER WAND RF STERILE (DRAPES) ×2 IMPLANT
CRADLE LAMINECT ARM (MISCELLANEOUS) ×2 IMPLANT
CUFF TOURN SGL QUICK 24 (TOURNIQUET CUFF) ×1
CUFF TOURN SGL QUICK 30 (TOURNIQUET CUFF)
CUFF TRNQT CYL 24X4X16.5-23 (TOURNIQUET CUFF) IMPLANT
CUFF TRNQT CYL 30X4X21-28X (TOURNIQUET CUFF) IMPLANT
DRAPE C-ARM XRAY 36X54 (DRAPES) ×2 IMPLANT
DRAPE C-ARMOR (DRAPES) ×2 IMPLANT
DRAPE INCISE IOBAN 66X45 STRL (DRAPES) ×2 IMPLANT
DRAPE TABLE BACK 80X90 (DRAPES) ×2 IMPLANT
DRILL BIT 2.5MM (BIT) ×1
DRILL BIT CAL (BIT) ×2
DRILL SLEEVE MEASURING (BIT) ×2
DRSG AQUACEL AG ADV 3.5X10 (GAUZE/BANDAGES/DRESSINGS) ×2 IMPLANT
ELECT REM PT RETURN 9FT ADLT (ELECTROSURGICAL) ×2
ELECTRODE REM PT RTRN 9FT ADLT (ELECTROSURGICAL) ×1 IMPLANT
GAUZE SPONGE 4X4 12PLY STRL (GAUZE/BANDAGES/DRESSINGS) ×2 IMPLANT
GAUZE XEROFORM 1X8 LF (GAUZE/BANDAGES/DRESSINGS) ×4 IMPLANT
GLOVE BIO SURGEON STRL SZ8 (GLOVE) ×3 IMPLANT
GLOVE INDICATOR 8.0 STRL GRN (GLOVE) ×3 IMPLANT
GLOVE SURG ORTHO 8.5 STRL (GLOVE) ×2 IMPLANT
GOWN STRL REUS W/ TWL LRG LVL3 (GOWN DISPOSABLE) ×1 IMPLANT
GOWN STRL REUS W/TWL LRG LVL3 (GOWN DISPOSABLE) ×1
GOWN STRL REUS W/TWL LRG LVL4 (GOWN DISPOSABLE) ×2 IMPLANT
GRAFT BNE CHIP CANC 1-8 40 (Bone Implant) IMPLANT
HEMOVAC 400CC 10FR (MISCELLANEOUS) ×1 IMPLANT
K-WIRE ACE 1.6X6 (WIRE) ×2
KIT TURNOVER KIT A (KITS) ×2 IMPLANT
KWIRE ACE 1.6X6 (WIRE) IMPLANT
NDL SPNL 22GX3.5 QUINCKE BK (NEEDLE) ×1 IMPLANT
NEEDLE SPNL 22GX3.5 QUINCKE BK (NEEDLE) ×2 IMPLANT
NS IRRIG 1000ML POUR BTL (IV SOLUTION) ×2 IMPLANT
PACK EXTREMITY ARMC (MISCELLANEOUS) ×2 IMPLANT
PAD CAST CTTN 4X4 STRL (SOFTGOODS) ×1 IMPLANT
PAD PREP 24X41 OB/GYN DISP (PERSONAL CARE ITEMS) ×2 IMPLANT
PAD WRAPON POLAR KNEE (MISCELLANEOUS) ×1 IMPLANT
PADDING CAST COTTON 4X4 STRL (SOFTGOODS) ×1
PLATE LOCK 7H STD LT PROX TIB (Plate) ×1 IMPLANT
SCREW CORTICAL 3.5MM  34MM (Screw) ×2 IMPLANT
SCREW CORTICAL 3.5MM  42MM (Screw) ×1 IMPLANT
SCREW CORTICAL 3.5MM 34MM (Screw) IMPLANT
SCREW CORTICAL 3.5MM 38MM (Screw) ×2 IMPLANT
SCREW CORTICAL 3.5MM 40MM (Screw) ×1 IMPLANT
SCREW CORTICAL 3.5MM 42MM (Screw) IMPLANT
SCREW LOCK 3.5X75 DIST TIB (Screw) ×1 IMPLANT
SCREW LOCK CANC STAR 4X75 (Screw) ×1 IMPLANT
SCREW LOCK CORT STAR 3.5X65 (Screw) ×2 IMPLANT
SCREW LOCK CORT STAR 3.5X70 (Screw) ×2 IMPLANT
SCREW LOCK CORT STAR 3.5X85 (Screw) ×1 IMPLANT
SCREW LP 3.5X75MM (Screw) ×1 IMPLANT
SCREW LP 3.5X85MM (Screw) ×1 IMPLANT
SPONGE LAP 18X18 RF (DISPOSABLE) ×4 IMPLANT
STAPLER SKIN PROX 35W (STAPLE) ×2 IMPLANT
STOCKINETTE BIAS CUT 4 980044 (GAUZE/BANDAGES/DRESSINGS) ×2 IMPLANT
STOCKINETTE BIAS CUT 6 980064 (GAUZE/BANDAGES/DRESSINGS) ×2 IMPLANT
STOCKINETTE IMPERVIOUS 9X36 MD (GAUZE/BANDAGES/DRESSINGS) ×2 IMPLANT
SUT VIC AB 2-0 CT1 27 (SUTURE) ×1
SUT VIC AB 2-0 CT1 TAPERPNT 27 (SUTURE) ×1 IMPLANT
SUT VICRYL+ 3-0 36IN CT-1 (SUTURE) ×2 IMPLANT
TAPE TRANSPORE STRL 2 31045 (GAUZE/BANDAGES/DRESSINGS) ×2 IMPLANT
WRAPON POLAR PAD KNEE (MISCELLANEOUS) ×2
hemovac ×1 IMPLANT

## 2019-03-05 NOTE — Progress Notes (Signed)
15 minute call to floor. 

## 2019-03-05 NOTE — Anesthesia Post-op Follow-up Note (Signed)
Anesthesia QCDR form completed.        

## 2019-03-05 NOTE — Consult Note (Signed)
ORTHOPAEDIC CONSULTATION  REQUESTING PHYSICIAN: Altamese Dilling, *  Chief Complaint: Left leg pain  HPI: Deborah Collins is a 47 y.o. female who complains of left leg pain after a fall on the stairs at home yesterday.  Patient has a 6-year history of multiple sclerosis and has weakness of the right leg.  She fell coming down the stairs and twisted the left knee severely.  She was brought to the emergency room where exam and x-rays revealed a comminuted proximal tibial fracture involving the severe pression of the lateral tibial plateau and some nondisplaced fractures medially.  Patient admitted for surgical medical evaluation and surgical fixation.  I have discussed treatment options with the patient.  I advised her that surgery is necessary to regain function of the knee and the risks and benefits were discussed.  She does wish to proceed today.  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  Her boyfriend's mother is been staying with her after hip fracture surgery in March.  Her health is otherwise good other than some history of anxiety and depression  Past Medical History:  Diagnosis Date  . Anxiety   . Depression   . Neuromuscular disorder (HCC)    multiple sclerosis   Past Surgical History:  Procedure Laterality Date  . CERVICAL CONIZATION W/BX N/A 12/21/2015   Procedure: CONIZATION CERVIX WITH BIOPSY;  Surgeon: Geryl Rankins, MD;  Location: [facility];  Service: Gynecology;  Laterality: N/A;  . WISDOM TOOTH EXTRACTION     Social History   Socioeconomic History  . Marital status: Divorced    Spouse name: Not on file  . Number of children: Not on file  . Years of education: Not on file  . Highest education level: Not on file  Occupational History  . Not on file  Social Needs  . Financial resource strain: Not on file  . Food insecurity:    Worry: Not on file    Inability: Not on file  . Transportation needs:    Medical: Not on  file    Non-medical: Not on file  Tobacco Use  . Smoking status: Former Games developer  . Smokeless tobacco: Never Used  Substance and Sexual Activity  . Alcohol use: Yes    Comment: occasional  . Drug use: No  . Sexual activity: Not on file  Lifestyle  . Physical activity:    Days per week: Not on file    Minutes per session: Not on file  . Stress: Not on file  Relationships  . Social connections:    Talks on phone: Not on file    Gets together: Not on file    Attends religious service: Not on file    Active member of club or organization: Not on file    Attends meetings of clubs or organizations: Not on file    Relationship status: Not on file  Other Topics Concern  . Not on file  Social History Narrative  . Not on file   History reviewed. No pertinent family history. Allergies  Allergen Reactions  . Sulfa Antibiotics Hives and Itching   Prior to Admission medications   Medication Sig Start Date End Date Taking? Authorizing Provider  amphetamine-dextroamphetamine (ADDERALL) 20 MG tablet Take 20 mg by mouth 2 (two) times daily.   Yes [provider]  clonazePAM (KLONOPIN) 1 MG tablet Take 1 mg by mouth 3 (three) times daily as needed for anxiety.    Yes [provider]  cyanocobalamin (,  VITAMIN B-12,) 1000 MCG/ML injection Inject 1,000 mcg into the muscle once a week. 02/24/19  Yes [provider]  gabapentin (NEURONTIN) 600 MG tablet Take 600 mg by mouth 4 (four) times daily as needed for pain. 02/24/19  Yes [provider]  L-Lysine 1000 MG TABS Take 1 tablet by mouth daily.   Yes [provider]  Melatonin 5 MG TABS Take 5 mg by mouth at bedtime.    Yes [provider]  MICROGESTIN FE 1/20 1-20 MG-MCG tablet Take 1 tablet by mouth daily.   Yes [provider]  ocrelizumab (OCREVUS) 300 MG/10ML injection Inject into the vein every 6 (six) months.    Yes [provider]  Vitamin D, Ergocalciferol, (DRISDOL)  1.25 MG (50000 UT) CAPS capsule Take 50,000 Units by mouth once a week. 02/24/19  Yes [provider]   Dg Chest 2 View  Result Date: 03/04/2019 CLINICAL DATA:  Preoperative evaluation, history of leukocytosis EXAM: CHEST - 2 VIEW COMPARISON:  None. FINDINGS: Cardiac shadows within normal limits. The lungs are well aerated bilaterally. No focal infiltrate or sizable effusion is seen. No bony abnormality is noted. No pneumothorax is noted. IMPRESSION: No acute abnormality noted. Electronically Signed   By: Alcide Clever M.D.   On: 03/04/2019 19:52   Ct Knee Left Wo Contrast  Result Date: 03/04/2019 CLINICAL DATA:  Fall today. Comminuted fracture of the proximal tibia. EXAM: CT OF THE LEFT KNEE WITHOUT CONTRAST TECHNIQUE: Multidetector CT imaging of the left knee was performed according to the standard protocol. Multiplanar CT image reconstructions were also generated. COMPARISON:  Radiographs same date FINDINGS: Bones/Joint/Cartilage There is an acute, extensively comminuted intra-articular fracture of the proximal tibia. There is significant depression of the lateral tibial plateau posteriorly by approximately 1.7 cm. There is posterior displacement of the metaphyseal cortex by 8 mm on sagittal image 74/6. There are nondisplaced fractures extending through the central tibia into the posterior aspect of the medial tibial plateau. Fractures extend into the proximal metadiaphysis. Both tibial spines are undermined. The fibular head appears intact and normally located. The distal femur and patella are intact. There is a large lipohemarthrosis. Ligaments Suboptimally assessed by CT.  The cruciate ligaments appear intact. Muscles and Tendons The extensor mechanism is intact.  No focal muscular hematoma. Soft tissues There is soft tissue swelling around the knee, especially in the popliteal fossa. Some of this may be secondary to capsular extravasation. No focal extra-articular hematoma demonstrated.  IMPRESSION: 1. Comminuted intra-articular fracture of the proximal tibia as described. This fracture has a significantly depressed component involving the lateral tibial plateau. There is extension of nondisplaced components into the medial tibial plateau and proximal metadiaphysis. 2. The distal femur, patella and proximal fibula appear intact. 3. Large hemarthrosis with soft tissue edema in the popliteal fossa. Electronically Signed   By: Carey Bullocks M.D.   On: 03/04/2019 17:25   Dg Knee Complete 4 Views Left  Result Date: 03/04/2019 CLINICAL DATA:  Initial evaluation for acute left knee pain status post fall. EXAM: LEFT KNEE - COMPLETE 4+ VIEW COMPARISON:  None. FINDINGS: Acute comminuted fracture of the proximal left tibia with extension through the lateral tibial plateau. Associated mild depression and displacement. Associated joint effusion. Soft tissue swelling present about the knee. Distal femur, patella, and fibula intact. IMPRESSION: Acute comminuted fracture of the proximal left tibia with extension through the lateral tibial plateau with mild displacement and depression. Electronically Signed   By: Janell Quiet.D.  On: 03/04/2019 15:35    Positive ROS: All other systems have been reviewed and were otherwise negative with the exception of those mentioned in the HPI and as above.  Physical Exam: General: Alert, no acute distress Cardiovascular: No pedal edema Respiratory: No cyanosis, no use of accessory musculature GI: No organomegaly, abdomen is soft and non-tender Skin: No lesions in the area of chief complaint Neurologic: Sensation intact distally Psychiatric: Patient is competent for consent with normal mood and affect Lymphatic: No axillary or cervical lymphadenopathy  MUSCULOSKELETAL: Patient alert and cooperative.  She is fully oriented.  She is very thin.  Left leg shows intact neurovascular status distally and intact ankle and toe motion.  He is tender and  swollen around the proximal tibia.  Hip is intact.  Right leg has no pain or deformity.  No other orthopedic injuries are noted.  Assessment: Comminuted depressed proximal tibial plateau fracture  Plan: Open reduction internal fixation left proximal tibial plateau fractures    Valinda HoarHoward E Kanyia Heaslip, MD [phone]   03/05/2019 11:22 AM

## 2019-03-05 NOTE — H&P (Signed)
THE PATIENT WAS SEEN PRIOR TO SURGERY TODAY.  HISTORY, ALLERGIES, HOME MEDICATIONS AND OPERATIVE PROCEDURE WERE REVIEWED. RISKS AND BENEFITS OF SURGERY DISCUSSED WITH PATIENT AGAIN.  NO CHANGES FROM INITIAL HISTORY AND PHYSICAL NOTED.    

## 2019-03-05 NOTE — Transfer of Care (Signed)
Immediate Anesthesia Transfer of Care Note  Patient: Deborah Collins  Procedure(s) Performed: Procedure(s): OPEN REDUCTION INTERNAL FIXATION (ORIF) TIBIAL PLATEAU (Left)  Patient Location: PACU  Anesthesia Type:General  Level of Consciousness: sedated  Airway & Oxygen Therapy: Patient Spontanous Breathing and Patient connected to face mask oxygen  Post-op Assessment: Report given to RN and Post -op Vital signs reviewed and stable  Post vital signs: Reviewed and stable  Last Vitals:  Vitals:   03/04/19 2115 03/05/19 1523  BP: (!) 107/52 126/60  Pulse: 65   Resp: 18 18  Temp: 37.1 C (!) 36.4 C  SpO2: 100% 100%    Complications: No apparent anesthesia complications

## 2019-03-05 NOTE — Anesthesia Procedure Notes (Signed)

## 2019-03-05 NOTE — Op Note (Signed)
03/05/2019  3:20 PM  PATIENT:  Deborah Collins    PRE-OPERATIVE DIAGNOSIS:  LEFT PROXIMAL  TIBIAL FRACTURES  POST-OPERATIVE DIAGNOSIS:  Same  PROCEDURE:  OPEN REDUCTION INTERNAL FIXATION (ORIF) TIBIAL PLATEAU LATERALLY   SURGEON:  Valinda Hoar, MD  ASST:     ANESTHESIA:   General  PREOPERATIVE INDICATIONS:  Deborah Collins is a  48 y.o. female with a diagnosis of left TIBIAL FRACTURE who failed conservative measures and elected for surgical management.    The risks benefits and alternatives were discussed with the patient preoperatively including but not limited to the risks of infection, bleeding, nerve injury, cardiopulmonary complications, the need for revision surgery, among others, and the patient was willing to proceed.  OPERATIVE IMPLANTS: 7 hole Zimmer [employer]omet lateral tibial plateau plate with multiple screws  OPERATIVE FINDINGS: Severely depressed comminuted left lateral tibial plateau fracture with nondisplaced fracture lines and medial plateau  EBL: Minimal  TOURNIQUET TIME: 131 MIN  COMPLICATIONS:   None  OPERATIVE PROCEDURE: The patient was brought to the operating room and underwent general LMA anesthesia in the supine position.  The leg was prepped and draped in a sterile fashion.  The left leg was exsanguinated and tourniquet inflated to 350 mmHg.  An S-shaped lateral incision was made along the lateral joint line and anterior tibia.  Dissection was carried out sharply through subcutaneous tissue.  The fascia was divided lateral to the anterior cortex of the tibia and followed posteriorly.  Sharp dissection was used to incise the lateral capsule beneath the meniscus a vertical incision was made in the anterior or lateral joint capsule to allow examination of the joint surface.  The fracture  was seen to be centrally depressed.  The lateral cortex was not violated.  Therefore holes were drilled and a small window made in the lateral cortex in the lateral flare of  the plateau.  A large bone tamp was introduced and tapped gradually up to  the lateral cortex which was then elevated.  Sequential fluoroscopy was used to follow the progress of this.  The lateral plateau was seen to elevate nicely parallel to the medial one.  Following this, 30 cc of freeze-dried bone chips were introduced into the void to support the plateau.  A 7 hole left proximal lateral tibial plate was then placed on the lateral portion of the tibia and pinned in place.  Fluoroscopy showed good positioning.  The proximal holes of the plate were filled with screws of multiple length and the 2 kickstand screws were put in place.  Multiple bicortical screws were placed distally.  Fluoroscopy showed the construct to be excellent and the lateral plateau to be in good position.  There was little and in situ falling the valgus with stress.  Wounds were irrigated.   The lateral capsule was then repaired with 0 Quill Vicryl suture.  The lateral fascia was then repaired with 0 Quill suture over a medium Hemovac drain.  Irrigation was carried out at each step.  Subcutaneous tissue was closed with 0 Quill and the skin was closed with staples.  Half percent Marcaine was placed in the wounds.  Dry sterile dressing with Polar Care and knee immobilizer were applied.  The tourniquet had been deflated with good return of blood flow to the foot.  She was awakened and taken recovery in good condition.  Valinda Hoar, MD

## 2019-03-05 NOTE — Anesthesia Preprocedure Evaluation (Signed)
Anesthesia Evaluation  Patient identified by MRN, date of birth, ID band Patient awake    Reviewed: Allergy & Precautions, H&P , NPO status , Patient's Chart, lab work & pertinent test results, reviewed documented beta blocker date and time   Airway Mallampati: II  TM Distance: >3 FB Neck ROM: full    Dental  (+) Teeth Intact   Pulmonary neg pulmonary ROS, former smoker,    Pulmonary exam normal        Cardiovascular Exercise Tolerance: Good negative cardio ROS Normal cardiovascular exam Rhythm:regular Rate:Normal     Neuro/Psych PSYCHIATRIC DISORDERS Anxiety Depression  Neuromuscular disease negative neurological ROS  negative psych ROS   GI/Hepatic negative GI ROS, Neg liver ROS,   Endo/Other  negative endocrine ROS  Renal/GU negative Renal ROS  negative genitourinary   Musculoskeletal   Abdominal   Peds  Hematology negative hematology ROS (+)   Anesthesia Other Findings Past Medical History: No date: Anxiety No date: Depression No date: Neuromuscular disorder (HCC)     Comment:  multiple sclerosis Past Surgical History: 12/21/2015: CERVICAL CONIZATION W/BX; N/A     Comment:  Procedure: CONIZATION CERVIX WITH BIOPSY;  Surgeon:               Geryl Rankins, MD;  Location: [facility];  Service:               Gynecology;  Laterality: N/A; No date: WISDOM TOOTH EXTRACTION BMI    Body Mass Index:  20.67 kg/m     Reproductive/Obstetrics negative OB ROS                             Anesthesia Physical Anesthesia Plan  ASA: II  Anesthesia Plan: General ETT   Post-op Pain Management:    Induction:   PONV Risk Score and Plan:   Airway Management Planned:   Additional Equipment:   Intra-op Plan:   Post-operative Plan:   Informed Consent: I have reviewed the patients History and Physical, chart, labs and discussed the procedure including the risks, benefits and alternatives for  the proposed anesthesia with the patient or authorized representative who has indicated his/her understanding and acceptance.     Dental Advisory Given  Plan Discussed with: CRNA  Anesthesia Plan Comments:         Anesthesia Quick Evaluation

## 2019-03-06 DIAGNOSIS — N390 Urinary tract infection, site not specified: Secondary | ICD-10-CM | POA: Diagnosis not present

## 2019-03-06 DIAGNOSIS — W19XXXA Unspecified fall, initial encounter: Secondary | ICD-10-CM | POA: Diagnosis not present

## 2019-03-06 DIAGNOSIS — S82209A Unspecified fracture of shaft of unspecified tibia, initial encounter for closed fracture: Secondary | ICD-10-CM | POA: Diagnosis not present

## 2019-03-06 DIAGNOSIS — D72829 Elevated white blood cell count, unspecified: Secondary | ICD-10-CM | POA: Diagnosis not present

## 2019-03-06 LAB — BASIC METABOLIC PANEL
Anion gap: 5 (ref 5–15)
BUN: 6 mg/dL (ref 6–20)
CO2: 23 mmol/L (ref 22–32)
Calcium: 8 mg/dL — ABNORMAL LOW (ref 8.9–10.3)
Chloride: 111 mmol/L (ref 98–111)
Creatinine, Ser: 0.5 mg/dL (ref 0.44–1.00)
GFR calc Af Amer: >60 mL/min (ref >60)
GFR calc non Af Amer: >60 mL/min (ref >60)
Glucose, Bld: 118 mg/dL — ABNORMAL HIGH (ref 70–99)
Potassium: 3.8 mmol/L (ref 3.5–5.1)
Sodium: 139 mmol/L (ref 135–145)

## 2019-03-06 LAB — CBC
HCT: 34.1 % — ABNORMAL LOW (ref 36.0–46.0)
Hemoglobin: 11.2 g/dL — ABNORMAL LOW (ref 12.0–15.0)
MCH: 31.2 pg (ref 26.0–34.0)
MCHC: 32.8 g/dL (ref 30.0–36.0)
MCV: 95 fL (ref 80.0–100.0)
Platelets: 182 10*3/uL (ref 150–400)
RBC: 3.59 MIL/uL — ABNORMAL LOW (ref 3.87–5.11)
RDW: 12 % (ref 11.5–15.5)
WBC: 15.9 10*3/uL — ABNORMAL HIGH (ref 4.0–10.5)
nRBC: 0 % (ref 0.0–0.2)

## 2019-03-06 LAB — URINE CULTURE: Culture: <10000 — AB

## 2019-03-06 LAB — HIV ANTIBODY (ROUTINE TESTING W REFLEX): HIV Screen 4th Generation wRfx: NONREACTIVE

## 2019-03-06 MED ORDER — SODIUM CHLORIDE 0.9 % IV SOLN
1.0000 g | INTRAVENOUS | Status: DC
  Administered 2019-03-06: 1 g via INTRAVENOUS
  Filled 2019-03-06: qty 1
  Filled 2019-03-06: qty 10

## 2019-03-06 MED ORDER — ENOXAPARIN SODIUM 40 MG/0.4ML ~~LOC~~ SOLN
40.0000 mg | SUBCUTANEOUS | Status: DC
  Administered 2019-03-06 – 2019-03-07 (×2): 40 mg via SUBCUTANEOUS
  Filled 2019-03-06 (×2): qty 0.4

## 2019-03-06 NOTE — Progress Notes (Signed)
Subjective: 1 Day Post-Op Procedure(s) (LRB): OPEN REDUCTION INTERNAL FIXATION (ORIF) TIBIAL PLATEAU (Left)    Patient reports pain as mild.  Out of bed in chair.  Alert and looks comfortable.  Started PT.  Hemovac drain removed.  Good movement of foot and ankle.  Wants to go home if possible.  Objective:   VITALS:   Vitals:   03/06/19 1150 03/06/19 1211  BP: 112/74 118/63  Pulse: 69 83  Resp:    Temp: 98.4 F (36.9 C)   SpO2: 100% 99%    Neurologically intact ABD soft Neurovascular intact Sensation intact distally Intact pulses distally Dorsiflexion/Plantar flexion intact Incision: no drainage  LABS Recent Labs    03/04/19 1805 03/05/19 0516 03/06/19 0449  HGB 14.2 12.4 11.2*  HCT 42.2 37.4 34.1*  WBC 18.9* 10.4 15.9*  PLT 244 197 182    Recent Labs    03/04/19 1805 03/05/19 0516 03/06/19 0449  NA 136 138 139  K 4.0 4.2 3.8  BUN 10 8 6   CREATININE 0.63 0.56 0.50  GLUCOSE 111* 125* 118*    Recent Labs    03/04/19 1824  INR 1.1     Assessment/Plan: 1 Day Post-Op Procedure(s) (LRB): OPEN REDUCTION INTERNAL FIXATION (ORIF) TIBIAL PLATEAU (Left)   Advance diet Up with therapy D/C IV fluids Discharge home with home health when PT approves.

## 2019-03-06 NOTE — Progress Notes (Signed)
[facility] [place] [facility]   PATIENT NAME: Deborah Collins    MR#:  [id number]  DATE OF BIRTH:  [birth year]  SUBJECTIVE:  CHIEF COMPLAINT:   Chief Complaint  Patient presents with  . Fall   Came after a fall due to loss of balance- have tibia fracture. S/p surgery, no complains.  REVIEW OF SYSTEMS:  CONSTITUTIONAL: No fever, fatigue or weakness.  EYES: No blurred or double vision.  EARS, NOSE, AND THROAT: No tinnitus or ear pain.  RESPIRATORY: No cough, shortness of breath, wheezing or hemoptysis.  CARDIOVASCULAR: No chest pain, orthopnea, edema.  GASTROINTESTINAL: No nausea, vomiting, diarrhea or abdominal pain.  GENITOURINARY: No dysuria, hematuria.  ENDOCRINE: No polyuria, nocturia,  HEMATOLOGY: No anemia, easy bruising or bleeding SKIN: No rash or lesion. MUSCULOSKELETAL: No joint pain or arthritis.   NEUROLOGIC: No tingling, numbness, weakness.  PSYCHIATRY: No anxiety or depression.   ROS  DRUG ALLERGIES:   Allergies  Allergen Reactions  . Sulfa Antibiotics Hives and Itching    VITALS:  Blood pressure (!) 114/47, pulse 68, temperature 98 F (36.7 C), resp. rate 18, height 5\' 9"  (1.753 m), weight 63.5 kg, last menstrual period 03/02/2019, SpO2 100 %.  PHYSICAL EXAMINATION:  GENERAL:  48 y.o.-year-old patient lying in the bed with no acute distress.  EYES: Pupils equal, round, reactive to light and accommodation. No scleral icterus. Extraocular muscles intact.  HEENT: Head atraumatic, normocephalic. Oropharynx and nasopharynx clear.  NECK:  Supple, no jugular venous distention. No thyroid enlargement, no tenderness.  LUNGS: Normal breath sounds bilaterally, no wheezing, rales,rhonchi or crepitation. No use of accessory muscles of respiration.  CARDIOVASCULAR: S1, S2 normal. No murmurs, rubs, or gallops.  ABDOMEN: Soft, nontender, nondistended. Bowel sounds present. No organomegaly or mass.  EXTREMITIES: No pedal edema, cyanosis, or  clubbing. LLL in cast and dressing post surgical. NEUROLOGIC: Cranial nerves II through XII are intact. Muscle strength 5/5 in all extremities except LL not moving much - post surgical. Sensation intact. Gait not checked.  PSYCHIATRIC: The patient is alert and oriented x 3.  SKIN: No obvious rash, lesion, or ulcer.   Physical Exam LABORATORY PANEL:   CBC Recent Labs  Lab 03/06/19 0449  WBC 15.9*  HGB 11.2*  HCT 34.1*  PLT 182   ------------------------------------------------------------------------------------------------------------------  Chemistries  Recent Labs  Lab 03/05/19 0516 03/06/19 0449  NA 138 139  K 4.2 3.8  CL 109 111  CO2 23 23  GLUCOSE 125* 118*  BUN 8 6  CREATININE 0.56 0.50  CALCIUM 8.3* 8.0*  MG 2.1  --    ------------------------------------------------------------------------------------------------------------------  Cardiac Enzymes No results for input(s): TROPONINI in the last 168 hours. ------------------------------------------------------------------------------------------------------------------  RADIOLOGY:  Dg Chest 2 View  Result Date: 03/04/2019 CLINICAL DATA:  Preoperative evaluation, history of leukocytosis EXAM: CHEST - 2 VIEW COMPARISON:  None. FINDINGS: Cardiac shadows within normal limits. The lungs are well aerated bilaterally. No focal infiltrate or sizable effusion is seen. No bony abnormality is noted. No pneumothorax is noted. IMPRESSION: No acute abnormality noted. Electronically Signed   By: Alcide Clever M.D.   On: 03/04/2019 19:52   Dg Tibia/fibula Left  Result Date: 03/05/2019 CLINICAL DATA:  Intraoperative LEFT tibial plateau fracture. EXAM: DG C-ARM 61-120 MIN; LEFT TIBIA AND FIBULA - 2 VIEW COMPARISON:  Plain film of the LEFT knee dated 03/04/2019. FINDINGS: Four intraoperative fluoroscopic images are provided demonstrating a plate and screw fixation of the proximal tibia. Hardware appears intact and appropriately  position. Osseous  alignment is anatomic. Fluoroscopy was provided for 2 minutes and 18 seconds. IMPRESSION: Intraoperative fluoroscopic images demonstrating plate and screw fixation of the proximal tibia. Hardware appears intact and appropriately positioned. No evidence of surgical complicating feature. Electronically Signed   By: Bary Richard M.D.   On: 03/05/2019 15:35   Ct Knee Left Wo Contrast  Result Date: 03/04/2019 CLINICAL DATA:  Fall today. Comminuted fracture of the proximal tibia. EXAM: CT OF THE LEFT KNEE WITHOUT CONTRAST TECHNIQUE: Multidetector CT imaging of the left knee was performed according to the standard protocol. Multiplanar CT image reconstructions were also generated. COMPARISON:  Radiographs same date FINDINGS: Bones/Joint/Cartilage There is an acute, extensively comminuted intra-articular fracture of the proximal tibia. There is significant depression of the lateral tibial plateau posteriorly by approximately 1.7 cm. There is posterior displacement of the metaphyseal cortex by 8 mm on sagittal image 74/6. There are nondisplaced fractures extending through the central tibia into the posterior aspect of the medial tibial plateau. Fractures extend into the proximal metadiaphysis. Both tibial spines are undermined. The fibular head appears intact and normally located. The distal femur and patella are intact. There is a large lipohemarthrosis. Ligaments Suboptimally assessed by CT.  The cruciate ligaments appear intact. Muscles and Tendons The extensor mechanism is intact.  No focal muscular hematoma. Soft tissues There is soft tissue swelling around the knee, especially in the popliteal fossa. Some of this may be secondary to capsular extravasation. No focal extra-articular hematoma demonstrated. IMPRESSION: 1. Comminuted intra-articular fracture of the proximal tibia as described. This fracture has a significantly depressed component involving the lateral tibial plateau. There is  extension of nondisplaced components into the medial tibial plateau and proximal metadiaphysis. 2. The distal femur, patella and proximal fibula appear intact. 3. Large hemarthrosis with soft tissue edema in the popliteal fossa. Electronically Signed   By: Carey Bullocks M.D.   On: 03/04/2019 17:25   Dg Knee Complete 4 Views Left  Result Date: 03/04/2019 CLINICAL DATA:  Initial evaluation for acute left knee pain status post fall. EXAM: LEFT KNEE - COMPLETE 4+ VIEW COMPARISON:  None. FINDINGS: Acute comminuted fracture of the proximal left tibia with extension through the lateral tibial plateau. Associated mild depression and displacement. Associated joint effusion. Soft tissue swelling present about the knee. Distal femur, patella, and fibula intact. IMPRESSION: Acute comminuted fracture of the proximal left tibia with extension through the lateral tibial plateau with mild displacement and depression. Electronically Signed   By: Rise Mu M.D.   On: 03/04/2019 15:35   Dg C-arm 1-60 Min  Result Date: 03/05/2019 CLINICAL DATA:  Intraoperative LEFT tibial plateau fracture. EXAM: DG C-ARM 61-120 MIN; LEFT TIBIA AND FIBULA - 2 VIEW COMPARISON:  Plain film of the LEFT knee dated 03/04/2019. FINDINGS: Four intraoperative fluoroscopic images are provided demonstrating a plate and screw fixation of the proximal tibia. Hardware appears intact and appropriately position. Osseous alignment is anatomic. Fluoroscopy was provided for 2 minutes and 18 seconds. IMPRESSION: Intraoperative fluoroscopic images demonstrating plate and screw fixation of the proximal tibia. Hardware appears intact and appropriately positioned. No evidence of surgical complicating feature. Electronically Signed   By: Bary Richard M.D.   On: 03/05/2019 15:35   Dg C-arm 1-60 Min  Result Date: 03/05/2019 CLINICAL DATA:  Intraoperative LEFT tibial plateau fracture. EXAM: DG C-ARM 61-120 MIN; LEFT TIBIA AND FIBULA - 2 VIEW COMPARISON:   Plain film of the LEFT knee dated 03/04/2019. FINDINGS: Four intraoperative fluoroscopic images are provided demonstrating a plate and  screw fixation of the proximal tibia. Hardware appears intact and appropriately position. Osseous alignment is anatomic. Fluoroscopy was provided for 2 minutes and 18 seconds. IMPRESSION: Intraoperative fluoroscopic images demonstrating plate and screw fixation of the proximal tibia. Hardware appears intact and appropriately positioned. No evidence of surgical complicating feature. Electronically Signed   By: Bary Richard M.D.   On: 03/05/2019 15:35   Dg C-arm 1-60 Min  Result Date: 03/05/2019 CLINICAL DATA:  Intraoperative LEFT tibial plateau fracture. EXAM: DG C-ARM 61-120 MIN; LEFT TIBIA AND FIBULA - 2 VIEW COMPARISON:  Plain film of the LEFT knee dated 03/04/2019. FINDINGS: Four intraoperative fluoroscopic images are provided demonstrating a plate and screw fixation of the proximal tibia. Hardware appears intact and appropriately position. Osseous alignment is anatomic. Fluoroscopy was provided for 2 minutes and 18 seconds. IMPRESSION: Intraoperative fluoroscopic images demonstrating plate and screw fixation of the proximal tibia. Hardware appears intact and appropriately positioned. No evidence of surgical complicating feature. Electronically Signed   By: Bary Richard M.D.   On: 03/05/2019 15:35    ASSESSMENT AND PLAN:   Active Problems:   Left tibial fracture   Tibial fracture  Patient is a 48 year old female with history of multiple sclerosis and anxiety with depression being admitted for left tibia fracture following mechanical fall.  1.  Mechanical fall with left tibia fracture S/p ORIF left tibia 03/05/19 by Dr. Hyacinth Meeker Pain control and DVT prophylaxis per Ortho. PT eval.  2.  Urinary tract infection Patient with leukocytosis with white count of 18,000.  No fevers.  Clinically patient does not appear septic. Started empirically on IV antibiotics with  Rocephin.  3. MS   Under control now.  DVT prophylaxis; SCDs Lovenox to be initiated tomorrow.    All the records are reviewed and case discussed with Care Management/Social Workerr. Management plans discussed with the patient, family and they are in agreement.  CODE STATUS: Full.  TOTAL TIME TAKING CARE OF THIS PATIENT: 35 minutes.     POSSIBLE D/C IN 1-2 DAYS, DEPENDING ON CLINICAL CONDITION.   Altamese Dilling M.D on 03/06/2019   Between 7am to 6pm - Pager - [phone]  After 6pm go to [email or web address] - password [email or web address]  [facility]  Office  [phone]  CC: Primary care physician; Christoper Allegra., MD  Note: This dictation was prepared with Dragon dictation along with smaller phrase technology. Any transcriptional errors that result from this process are unintentional.

## 2019-03-06 NOTE — TOC Initial Note (Signed)
Transition of Care [facility]) - Initial/Assessment Note    Patient Details  Name: Deborah Collins MRN: [id number] Date of Birth: 1971-05-09  Transition of [facility]) CM/SW Contact:    Latanya Maudlin, RN Phone Number: 03/06/2019, 12:17 PM  Clinical Narrative:  [facility] team met with patient to discuss disposition needs. DME orders in place for rolling walker, obtained from [place] [facility] [employer] and delivered to bedside. Patient currently lives with her mother and is proving care for her after a leg fracture. Patient is agreeable to home health if needed but prefers to progress at not need it. Patient also inquiring about possible co pay. CMS Medicare.gov Compare Post Acute Care list reviewed with patient and she has no preference. Referral placed with Helene Kelp at [facility]. Helene Kelp to check with [facility] team once discharge is planned. PCP is Effie Shy. Copy.   TOC to follow to complete disposition.               Expected Discharge Plan: [place] Barriers to Discharge: Continued Medical Work up   Patient Goals and CMS Choice Patient states their goals for this hospitalization and ongoing recovery are:: to be strong enough to not need help CMS Medicare.gov Compare Post Acute Care list provided to:: Patient Choice offered to / list presented to : Patient  Expected Discharge Plan and Services Expected Discharge Plan: [place]   Discharge Planning Services: CM Consult Post Acute Care Choice: Home Health, Durable Medical Equipment Living arrangements for the past 2 months: Single Family Home                 DME Arranged: Walker rolling DME Agency: [employer] Date DME Agency Contacted: 03/06/19 Time DME Agency Contacted: 1217 Representative spoke with at DME Agency: brad [place]: PT [place]: [facility] [employer] [facility] [employer] Date [place]: 03/06/19 Time [place]: 1217 Representative spoke with at [place]:  teresa  Prior Living Arrangements/Services Living arrangements for the past 2 months: [place] Lives with:: Parents Patient language and need for interpreter reviewed:: Yes Do you feel safe going back to the place where you live?: Yes      Need for Family Participation in Patient Care: No (Comment) Care giver support system in place?: No (comment)   Criminal Activity/Legal Involvement Pertinent to Current Situation/Hospitalization: No - Comment as needed  Activities of Daily Living Home Assistive Devices/Equipment: None ADL Screening (condition at time of admission) Patient's cognitive ability adequate to safely complete daily activities?: Yes Is the patient deaf or have difficulty hearing?: No Does the patient have difficulty seeing, even when wearing glasses/contacts?: No Does the patient have difficulty concentrating, remembering, or making decisions?: No Patient able to express need for assistance with ADLs?: Yes Does the patient have difficulty dressing or bathing?: No Independently performs ADLs?: Yes (appropriate for developmental age) Does the patient have difficulty walking or climbing stairs?: No Weakness of Legs: Right Weakness of Arms/Hands: Right  Permission Sought/Granted Permission sought to share information with : Case Manager                Emotional Assessment Appearance:: Appears stated age Attitude/Demeanor/Rapport: Engaged Affect (typically observed): Accepting Orientation: : Oriented to Self, Oriented to  Time, Oriented to Place, Oriented to Situation      Admission diagnosis:  Tibial plateau fracture, left, closed, initial encounter [S82.142A] Closed fracture of proximal end of left tibia, unspecified fracture morphology, initial encounter [S82.102A] Patient  Active Problem List   Diagnosis Date Noted  . Tibial fracture 03/05/2019  . Left tibial fracture 03/04/2019  . CIS (carcinoma in situ of cervix) 12/21/2015   PCP:  Jacky Kindle., MD Pharmacy:   [facility] [id number], [address] [place] [address] Phone: [phone] Fax: [phone]  [place], [place] - [address] [place] [address] Phone: [phone] Fax: [phone]     Social Determinants of Health (SDOH) Interventions    Readmission Risk Interventions Readmission Risk Prevention Plan 03/06/2019  Post Dischage Appt Complete  Medication Screening Complete  Transportation Screening Complete  Some recent data might be hidden

## 2019-03-06 NOTE — Progress Notes (Signed)
[employer] [facility] [facility] [place] [facility]   PATIENT NAME: Deborah Collins    MR#:  [id number]  DATE OF BIRTH:  [birth year]  SUBJECTIVE:  CHIEF COMPLAINT:   Chief Complaint  Patient presents with  . Fall   Came after a fall due to loss of balance- have tibia fracture. S/p surgery, no complains.  Less pain today, able to participate with physical therapy today.  REVIEW OF SYSTEMS:  CONSTITUTIONAL: No fever, fatigue or weakness.  EYES: No blurred or double vision.  EARS, NOSE, AND THROAT: No tinnitus or ear pain.  RESPIRATORY: No cough, shortness of breath, wheezing or hemoptysis.  CARDIOVASCULAR: No chest pain, orthopnea, edema.  GASTROINTESTINAL: No nausea, vomiting, diarrhea or abdominal pain.  GENITOURINARY: No dysuria, hematuria.  ENDOCRINE: No polyuria, nocturia,  HEMATOLOGY: No anemia, easy bruising or bleeding SKIN: No rash or lesion. MUSCULOSKELETAL: No joint pain or arthritis.   NEUROLOGIC: No tingling, numbness, weakness.  PSYCHIATRY: No anxiety or depression.   ROS  DRUG ALLERGIES:   Allergies  Allergen Reactions  . Sulfa Antibiotics Hives and Itching    VITALS:  Blood pressure 118/63, pulse 83, temperature 98.4 F (36.9 C), resp. rate 18, height  (1.753 m), weight 63.5 kg, last menstrual period 03/02/2019, SpO2 99 %.  PHYSICAL EXAMINATION:  GENERAL:  48 y.o.-year-old patient lying in the bed with no acute distress.  EYES: Pupils equal, round, reactive to light and accommodation. No scleral icterus. Extraocular muscles intact.  HEENT: Head atraumatic, normocephalic. Oropharynx and nasopharynx clear.  NECK:  Supple, no jugular venous distention. No thyroid enlargement, no tenderness.  LUNGS: Normal breath sounds bilaterally, no wheezing, rales,rhonchi or crepitation. No use of accessory muscles of respiration.  CARDIOVASCULAR: S1, S2 normal. No murmurs, rubs, or gallops.  ABDOMEN: Soft, nontender, nondistended. Bowel sounds present. No  organomegaly or mass.  EXTREMITIES: No pedal edema, cyanosis, or clubbing. LLL in cast and dressing post surgical. NEUROLOGIC: Cranial nerves II through XII are intact. Muscle strength 5/5 in all extremities except LL not moving much - post surgical. Sensation intact. Gait not checked.  PSYCHIATRIC: The patient is alert and oriented x 3.  SKIN: No obvious rash, lesion, or ulcer.   Physical Exam LABORATORY PANEL:   CBC Recent Labs  Lab 03/06/19 0449  WBC 15.9*  HGB 11.2*  HCT 34.1*  PLT 182   ------------------------------------------------------------------------------------------------------------------  Chemistries  Recent Labs  Lab 03/05/19 0516 03/06/19 0449  NA 138 139  K 4.2 3.8  CL 109 111  CO2 23 23  GLUCOSE 125* 118*  BUN 8 6  CREATININE 0.56 0.50  CALCIUM 8.3* 8.0*  MG 2.1  --    ------------------------------------------------------------------------------------------------------------------  Cardiac Enzymes No results for input(s): TROPONINI in the last 168 hours. ------------------------------------------------------------------------------------------------------------------  RADIOLOGY:  Dg Chest 2 View  Result Date: 03/04/2019 CLINICAL DATA:  Preoperative evaluation, history of leukocytosis EXAM: CHEST - 2 VIEW COMPARISON:  None. FINDINGS: Cardiac shadows within normal limits. The lungs are well aerated bilaterally. No focal infiltrate or sizable effusion is seen. No bony abnormality is noted. No pneumothorax is noted. IMPRESSION: No acute abnormality noted. Electronically Signed   By: Alcide Clever M.D.   On: 03/04/2019 19:52   Dg Tibia/fibula Left  Result Date: 03/05/2019 CLINICAL DATA:  Intraoperative LEFT tibial plateau fracture. EXAM: DG C-ARM 61-120 MIN; LEFT TIBIA AND FIBULA - 2 VIEW COMPARISON:  Plain film of the LEFT knee dated 03/04/2019. FINDINGS: Four intraoperative fluoroscopic images are provided demonstrating a plate and screw fixation of  the proximal tibia. Hardware appears intact and appropriately position. Osseous alignment is anatomic. Fluoroscopy was provided for 2 minutes and 18 seconds. IMPRESSION: Intraoperative fluoroscopic images demonstrating plate and screw fixation of the proximal tibia. Hardware appears intact and appropriately positioned. No evidence of surgical complicating feature. Electronically Signed   By: Bary Richard M.D.   On: 03/05/2019 15:35   Ct Knee Left Wo Contrast  Result Date: 03/04/2019 CLINICAL DATA:  Fall today. Comminuted fracture of the proximal tibia. EXAM: CT OF THE LEFT KNEE WITHOUT CONTRAST TECHNIQUE: Multidetector CT imaging of the left knee was performed according to the standard protocol. Multiplanar CT image reconstructions were also generated. COMPARISON:  Radiographs same date FINDINGS: Bones/Joint/Cartilage There is an acute, extensively comminuted intra-articular fracture of the proximal tibia. There is significant depression of the lateral tibial plateau posteriorly by approximately 1.7 cm. There is posterior displacement of the metaphyseal cortex by 8 mm on sagittal image 74/6. There are nondisplaced fractures extending through the central tibia into the posterior aspect of the medial tibial plateau. Fractures extend into the proximal metadiaphysis. Both tibial spines are undermined. The fibular head appears intact and normally located. The distal femur and patella are intact. There is a large lipohemarthrosis. Ligaments Suboptimally assessed by CT.  The cruciate ligaments appear intact. Muscles and Tendons The extensor mechanism is intact.  No focal muscular hematoma. Soft tissues There is soft tissue swelling around the knee, especially in the popliteal fossa. Some of this may be secondary to capsular extravasation. No focal extra-articular hematoma demonstrated. IMPRESSION: 1. Comminuted intra-articular fracture of the proximal tibia as described. This fracture has a significantly depressed  component involving the lateral tibial plateau. There is extension of nondisplaced components into the medial tibial plateau and proximal metadiaphysis. 2. The distal femur, patella and proximal fibula appear intact. 3. Large hemarthrosis with soft tissue edema in the popliteal fossa. Electronically Signed   By: Carey Bullocks M.D.   On: 03/04/2019 17:25   Dg Knee Complete 4 Views Left  Result Date: 03/04/2019 CLINICAL DATA:  Initial evaluation for acute left knee pain status post fall. EXAM: LEFT KNEE - COMPLETE 4+ VIEW COMPARISON:  None. FINDINGS: Acute comminuted fracture of the proximal left tibia with extension through the lateral tibial plateau. Associated mild depression and displacement. Associated joint effusion. Soft tissue swelling present about the knee. Distal femur, patella, and fibula intact. IMPRESSION: Acute comminuted fracture of the proximal left tibia with extension through the lateral tibial plateau with mild displacement and depression. Electronically Signed   By: Rise Mu M.D.   On: 03/04/2019 15:35   Dg C-arm 1-60 Min  Result Date: 03/05/2019 CLINICAL DATA:  Intraoperative LEFT tibial plateau fracture. EXAM: DG C-ARM 61-120 MIN; LEFT TIBIA AND FIBULA - 2 VIEW COMPARISON:  Plain film of the LEFT knee dated 03/04/2019. FINDINGS: Four intraoperative fluoroscopic images are provided demonstrating a plate and screw fixation of the proximal tibia. Hardware appears intact and appropriately position. Osseous alignment is anatomic. Fluoroscopy was provided for 2 minutes and 18 seconds. IMPRESSION: Intraoperative fluoroscopic images demonstrating plate and screw fixation of the proximal tibia. Hardware appears intact and appropriately positioned. No evidence of surgical complicating feature. Electronically Signed   By: Bary Richard M.D.   On: 03/05/2019 15:35   Dg C-arm 1-60 Min  Result Date: 03/05/2019 CLINICAL DATA:  Intraoperative LEFT tibial plateau fracture. EXAM: DG  C-ARM 61-120 MIN; LEFT TIBIA AND FIBULA - 2 VIEW COMPARISON:  Plain film of the LEFT knee dated 03/04/2019. FINDINGS:  Four intraoperative fluoroscopic images are provided demonstrating a plate and screw fixation of the proximal tibia. Hardware appears intact and appropriately position. Osseous alignment is anatomic. Fluoroscopy was provided for 2 minutes and 18 seconds. IMPRESSION: Intraoperative fluoroscopic images demonstrating plate and screw fixation of the proximal tibia. Hardware appears intact and appropriately positioned. No evidence of surgical complicating feature. Electronically Signed   By: Bary Richard M.D.   On: 03/05/2019 15:35   Dg C-arm 1-60 Min  Result Date: 03/05/2019 CLINICAL DATA:  Intraoperative LEFT tibial plateau fracture. EXAM: DG C-ARM 61-120 MIN; LEFT TIBIA AND FIBULA - 2 VIEW COMPARISON:  Plain film of the LEFT knee dated 03/04/2019. FINDINGS: Four intraoperative fluoroscopic images are provided demonstrating a plate and screw fixation of the proximal tibia. Hardware appears intact and appropriately position. Osseous alignment is anatomic. Fluoroscopy was provided for 2 minutes and 18 seconds. IMPRESSION: Intraoperative fluoroscopic images demonstrating plate and screw fixation of the proximal tibia. Hardware appears intact and appropriately positioned. No evidence of surgical complicating feature. Electronically Signed   By: Bary Richard M.D.   On: 03/05/2019 15:35    ASSESSMENT AND PLAN:   Active Problems:   Left tibial fracture   Tibial fracture  Patient is a 48 year old female with history of multiple sclerosis and anxiety with depression being admitted for left tibia fracture following mechanical fall.  1.  Mechanical fall with left tibia fracture S/p ORIF left tibia 03/05/19 by Dr. Hyacinth Meeker Pain control and DVT prophylaxis per Ortho. PT eval. suggested home with home health after initial session today.  2.  Urinary tract infection Patient with leukocytosis with  white count of 18,000.  No fevers.  Clinically patient does not appear septic. Started empirically on IV antibiotics with Rocephin.  3. MS   Under control now.  DVT prophylaxis; SCDs Lovenox STARTED.    All the records are reviewed and case discussed with Care Management/Social Workerr. Management plans discussed with the patient, family and they are in agreement.  CODE STATUS: Full.  TOTAL TIME TAKING CARE OF THIS PATIENT: 35 minutes.     POSSIBLE D/C IN 1-2 DAYS, DEPENDING ON CLINICAL CONDITION.   Altamese Dilling M.D on 03/06/2019   Between 7am to 6pm - Pager - [phone]  After 6pm go to [email or web address] - password [email or web address]  [facility]  Office  [phone]  CC: Primary care physician; Christoper Allegra., MD  Note: This dictation was prepared with Dragon dictation along with smaller phrase technology. Any transcriptional errors that result from this process are unintentional.

## 2019-03-06 NOTE — Evaluation (Addendum)
Physical Therapy Evaluation Patient Details Name: Deborah Collins MRN: [id number] DOB: 1971-03-10 Today's Date: 03/06/2019   History of Present Illness  Pt is 48 yo female s/p ORIF of left tibia 5/23. PMH of smoking, anxiety, depression, MS  Clinical Impression  Pt alert, oriented, able to report NWB precautions of LLE to PT without education. Reported that her home life is complicated at the moment, but she will have her mother in law, family/friends, and her boyfriend to assist her when he returns from his trip. Previously independent.   KI in place when PT entered room, donned throughout session. Pt BP also assessed in multiple positions WFLs. Supine to sit with minA for LLE management, and pt was able to sit EOB with fair balance. Sit <> Stand several times during session, CGA and verbal cues for hand placement. Pt transferred to chair ~26ft with RW and CGA, and after a sitting rest break able to ambulate in room ~17ft. Pt exhibited hop to gait, and pt had excellent adherence to NWB to LLE. Some instability noted, but pt able to self correct. Verbal cues for RW management and sequencing of movements, pt tends to hop too far forward into RW.  Overall the patient demonstrated deficits (see "PT Problem List") that impede the patient's functional abilities, safety, and mobility and would benefit from skilled PT intervention. Recommendation is HHPT with supervision for mobility/OOB.      Follow Up Recommendations Home health PT;Supervision for mobility/OOB    Equipment Recommendations  Rolling walker with 5" wheels    Recommendations for Other Services       Precautions / Restrictions Precautions Precautions: Fall Required Braces or Orthoses: Knee Immobilizer - Left Knee Immobilizer - Left: On at all times Restrictions Weight Bearing Restrictions: Yes LLE Weight Bearing: Non weight bearing      Mobility  Bed Mobility Overal bed mobility: Needs Assistance Bed Mobility: Supine to  Sit     Supine to sit: Min assist     General bed mobility comments: minA for LLE management  Transfers Overall transfer level: Needs assistance Equipment used: Rolling walker (2 wheeled) Transfers: Sit to/from Stand Sit to Stand: Min guard         General transfer comment: cues for hand placement to increase safety  Ambulation/Gait   Gait Distance (Feet): 20 Feet Assistive device: Rolling walker (2 wheeled)       General Gait Details: Hop to gait, pt able to adhere well to NWB to LLE. some instability noted, but pt able to self correct. verbal cues for RW management and sequencing of movements  Stairs            Wheelchair Mobility    Modified Rankin (Stroke Patients Only)       Balance Overall balance assessment: Needs assistance Sitting-balance support: Feet supported Sitting balance-Leahy Scale: Fair       Standing balance-Leahy Scale: Fair                               Pertinent Vitals/Pain Pain Assessment: 0-10 Pain Score: 4  Pain Location: in L leg, also complaining of abdominal pain Pain Descriptors / Indicators: Tender Pain Intervention(s): Limited activity within patient's tolerance;Monitored during session;Repositioned;Premedicated before session    Home Living Family/patient expects to be discharged to:: Private residence Living Arrangements: (mother in law staying with her currently, significant other to return from [place] sometime soon) Available Help at Discharge: Family;Friend(s);Neighbor Type of Home: [facility]  Access: Stairs to enter Entrance Stairs-Rails: Right Entrance Stairs-Number of Steps: 2 Home Layout: One level Home Equipment: Walker - 4 wheels;Bedside commode;Wheelchair - manual(BSC does not fit over standard commode, too small of a space)      Prior Function Level of Independence: Independent               Hand Dominance   Dominant Hand: Left    Extremity/Trunk Assessment   Upper Extremity  Assessment Upper Extremity Assessment: Overall WFL for tasks assessed    Lower Extremity Assessment Lower Extremity Assessment: RLE deficits/detail;LLE deficits/detail RLE Deficits / Details: able to hop well on RLE LLE Deficits / Details: able to PF/DF LLE: Unable to fully assess due to immobilization    Cervical / Trunk Assessment Cervical / Trunk Assessment: Normal  Communication   Communication: No difficulties  Cognition Arousal/Alertness: Awake/alert Behavior During Therapy: WFL for tasks assessed/performed Overall Cognitive Status: Within Functional Limits for tasks assessed                                        General Comments      Exercises     Assessment/Plan    PT Assessment Patient needs continued PT services  PT Problem List Decreased strength;Decreased mobility;Decreased range of motion;Decreased activity tolerance;Decreased balance;Decreased knowledge of use of DME;Pain       PT Treatment Interventions DME instruction;Functional mobility training;Balance training;Patient/family education;Therapeutic activities;Gait training;Neuromuscular re-education;Stair training;Therapeutic exercise    PT Goals (Current goals can be found in the Care Plan section)  Acute Rehab PT Goals Patient Stated Goal: to go home PT Goal Formulation: With patient Time For Goal Achievement: 03/20/19 Potential to Achieve Goals: Good    Frequency BID   Barriers to discharge        Co-evaluation               AM-PAC PT "6 Clicks" Mobility  Outcome Measure Help needed turning from your back to your side while in a flat bed without using bedrails?: A Little Help needed moving from lying on your back to sitting on the side of a flat bed without using bedrails?: A Little Help needed moving to and from a bed to a chair (including a wheelchair)?: A Little Help needed standing up from a chair using your arms (e.g., wheelchair or bedside chair)?: A Little Help  needed to walk in hospital room?: A Little Help needed climbing 3-5 steps with a railing? : A Little 6 Click Score: 18    End of Session Equipment Utilized During Treatment: Gait belt Activity Tolerance: Patient limited by pain;Patient tolerated treatment well(abdominal pain) Patient left: in chair;with SCD's reapplied;with call bell/phone within reach;with chair alarm set;with nursing/sitter in room Nurse Communication: Mobility status PT Visit Diagnosis: Unsteadiness on feet (R26.81);Muscle weakness (generalized) (M62.81);Difficulty in walking, not elsewhere classified (R26.2);Pain Pain - Right/Left: Left Pain - part of body: Leg    Time: 1012-1050 PT Time Calculation (min) (ACUTE ONLY): 38 min   Charges:   PT Evaluation $PT Eval Low Complexity: 1 Low PT Treatments $Gait Training: 8-22 mins $Therapeutic Exercise: 8-22 mins        Olga Coasteriana Kha Hari PT, DPT 12:25 PM,03/06/19 [phone]

## 2019-03-07 DIAGNOSIS — D72829 Elevated white blood cell count, unspecified: Secondary | ICD-10-CM | POA: Diagnosis not present

## 2019-03-07 DIAGNOSIS — S82209A Unspecified fracture of shaft of unspecified tibia, initial encounter for closed fracture: Secondary | ICD-10-CM | POA: Diagnosis not present

## 2019-03-07 DIAGNOSIS — W19XXXA Unspecified fall, initial encounter: Secondary | ICD-10-CM | POA: Diagnosis not present

## 2019-03-07 DIAGNOSIS — N390 Urinary tract infection, site not specified: Secondary | ICD-10-CM | POA: Diagnosis not present

## 2019-03-07 LAB — CBC
HCT: 32.1 % — ABNORMAL LOW (ref 36.0–46.0)
Hemoglobin: 10.3 g/dL — ABNORMAL LOW (ref 12.0–15.0)
MCH: 30.9 pg (ref 26.0–34.0)
MCHC: 32.1 g/dL (ref 30.0–36.0)
MCV: 96.4 fL (ref 80.0–100.0)
Platelets: 182 10*3/uL (ref 150–400)
RBC: 3.33 MIL/uL — ABNORMAL LOW (ref 3.87–5.11)
RDW: 12.2 % (ref 11.5–15.5)
WBC: 10.1 10*3/uL (ref 4.0–10.5)
nRBC: 0 % (ref 0.0–0.2)

## 2019-03-07 MED ORDER — DOCUSATE SODIUM 100 MG PO CAPS: 100.0000 mg | ORAL_CAPSULE | Freq: Two times a day (BID) | ORAL | 0 refills | Status: DC

## 2019-03-07 MED ORDER — ASPIRIN EC 325 MG PO TBEC: 325.0000 mg | DELAYED_RELEASE_TABLET | Freq: Two times a day (BID) | ORAL | 0 refills | Status: AC

## 2019-03-07 MED ORDER — HYDROCODONE-ACETAMINOPHEN 5-325 MG PO TABS: 1.0000 | ORAL_TABLET | ORAL | 0 refills | Status: DC | PRN

## 2019-03-07 NOTE — Progress Notes (Signed)
Subjective: 2 Days Post-Op Procedure(s) (LRB): OPEN REDUCTION INTERNAL FIXATION (ORIF) TIBIAL PLATEAU (Left)    Patient reports pain as mild.  Doing very well.  Ready to go home with home health PT.  Will return to my office in 1 week for exam and x-rays.  We will taken enteric coated aspirin 325 mg twice daily.  Objective:   VITALS:   Vitals:   03/07/19 0334 03/07/19 0818  BP: (!) 112/45 115/62  Pulse: 69 64  Resp: 18 20  Temp: 98.2 F (36.8 C) 98.2 F (36.8 C)  SpO2: 100% 100%    Neurologically intact ABD soft Neurovascular intact Sensation intact distally Intact pulses distally Dorsiflexion/Plantar flexion intact Incision: dressing C/D/I  LABS Recent Labs    03/05/19 0516 03/06/19 0449 03/07/19 0520  HGB 12.4 11.2* 10.3*  HCT 37.4 34.1* 32.1*  WBC 10.4 15.9* 10.1  PLT 197 182 182    Recent Labs    03/04/19 1805 03/05/19 0516 03/06/19 0449  NA 136 138 139  K 4.0 4.2 3.8  BUN 10 8 6   CREATININE 0.63 0.56 0.50  GLUCOSE 111* 125* 118*    Recent Labs    03/04/19 1824  INR 1.1     Assessment/Plan: 2 Days Post-Op Procedure(s) (LRB): OPEN REDUCTION INTERNAL FIXATION (ORIF) TIBIAL PLATEAU (Left)   Up with therapy Discharge home with home health   Return to clinic 1 week for exam and x-ray  Enteric-coated aspirin 325 mg twice daily

## 2019-03-07 NOTE — Discharge Summary (Signed)
[facility] - [place] at [facility]   PATIENT NAME: Deborah Collins    MR#:  [id number]  DATE OF BIRTH:  08/11/1971  DATE OF ADMISSION:  03/04/2019 ADMITTING PHYSICIAN: Jama Flavors, MD  DATE OF DISCHARGE: 03/07/2019   PRIMARY CARE PHYSICIAN: Christoper Allegra., MD    ADMISSION DIAGNOSIS:  Tibial plateau fracture, left, closed, initial encounter [S82.142A] Closed fracture of proximal end of left tibia, unspecified fracture morphology, initial encounter [S82.102A]  DISCHARGE DIAGNOSIS:  Active Problems:   Left tibial fracture   Tibial fracture   SECONDARY DIAGNOSIS:   Past Medical History:  Diagnosis Date  . Anxiety   . Depression   . Neuromuscular disorder [facility])    multiple sclerosis    HOSPITAL COURSE:   Patient is a 48 year old female with history of multiple sclerosis and anxiety with depression being admitted for left tibia fracture following mechanical fall.  1.Mechanical fall with left tibia fracture S/p ORIF left tibia 03/05/19 by Dr. Hyacinth Meeker Pain control and DVT prophylaxis per Ortho. PT eval. suggested home with home health after initial session . Ortho suggested ASA 325 mg BID.  2.Urinary tract infection Patient with leukocytosis with white count of 18,000. No fevers. Clinically patient does not appear septic. Started empirically on IV antibiotics with Rocephin.  3. MS   Under control now.  DVT prophylaxis;SCDs Lovenox STARTED.  DISCHARGE CONDITIONS:   Stable.  CONSULTS OBTAINED:  Treatment Team:  Deeann Saint, MD  DRUG ALLERGIES:   Allergies  Allergen Reactions  . Sulfa Antibiotics Hives and Itching    DISCHARGE MEDICATIONS:   Allergies as of 03/07/2019      Reactions   Sulfa Antibiotics Hives, Itching      Medication List    TAKE these medications   amphetamine-dextroamphetamine 20 MG tablet Commonly known as:  ADDERALL Take 20 mg by mouth 2 (two) times daily. Notes to patient:  Last dose  given this morning at 8:51   aspirin EC 325 MG tablet Take 1 tablet (325 mg total) by mouth 2 (two) times daily.   clonazePAM 1 MG tablet Commonly known as:  KLONOPIN Take 1 mg by mouth 3 (three) times daily as needed for anxiety.   cyanocobalamin 1000 MCG/ML injection Commonly known as:  (VITAMIN B-12) Inject 1,000 mcg into the muscle once a week. Notes to patient:  Last dose unknown   docusate sodium 100 MG capsule Commonly known as:  COLACE Take 1 capsule (100 mg total) by mouth 2 (two) times daily. Notes to patient:  Last dose given last night at 9:22 pm   gabapentin 600 MG tablet Commonly known as:  NEURONTIN Take 600 mg by mouth 4 (four) times daily as needed for pain. Notes to patient:  Last dose given this morning at 8:54   HYDROcodone-acetaminophen 5-325 MG tablet Commonly known as:  NORCO/VICODIN Take 1 tablet by mouth every 4 (four) hours as needed for moderate pain or severe pain. Notes to patient:  Last dose given yesterday morning   L-Lysine 1000 MG Tabs Take 1 tablet by mouth daily. Notes to patient:  Last dose unknown   Melatonin 5 MG Tabs Take 5 mg by mouth at bedtime. Notes to patient:  Last dose given last night at 9:22 pm   Microgestin FE 1/20 1-20 MG-MCG tablet Generic drug:  norethindrone-ethinyl estradiol Take 1 tablet by mouth daily. Notes to patient:  Last dose unknown   ocrelizumab 300 MG/10ML injection Commonly known as:  OCREVUS Inject into the vein every 6 (  six) months. Notes to patient:  Last dose unknown.   Vitamin D (Ergocalciferol) 1.25 MG (50000 UT) Caps capsule Commonly known as:  DRISDOL Take 50,000 Units by mouth once a week. Notes to patient:  Last dose unknown            Durable Medical Equipment  (From admission, onward)         Start     Ordered   03/07/19 1345  For home use only DME Walker rolling  [facility](Walkers)  Once    Question:  Patient needs a walker to treat with the following condition  Answer:  Tibia fracture    03/07/19 1345   03/05/19 1623  DME Walker rolling  Once    Question:  Patient needs a walker to treat with the following condition  Answer:  Total knee replacement status   03/05/19 1622           DISCHARGE INSTRUCTIONS:    Follow with PMD in 1 to 2 weeks.  Follow with orthopedics in 1 to 2 weeks.  If you experience worsening of your admission symptoms, develop shortness of breath, life threatening emergency, suicidal or homicidal thoughts you must seek medical attention immediately by calling 911 or calling your MD immediately  if symptoms less severe.  You Must read complete instructions/literature along with all the possible adverse reactions/side effects for all the Medicines you take and that have been prescribed to you. Take any new Medicines after you have completely understood and accept all the possible adverse reactions/side effects.   Please note  You were cared for by a hospitalist during your hospital stay. If you have any questions about your discharge medications or the care you received while you were in the hospital after you are discharged, you can call the unit and asked to speak with the hospitalist on call if the hospitalist that took care of you is not available. Once you are discharged, your primary care physician will handle any further medical issues. Please note that NO REFILLS for any discharge medications will be authorized once you are discharged, as it is imperative that you return to your primary care physician (or establish a relationship with a primary care physician if you do not have one) for your aftercare needs so that they can reassess your need for medications and monitor your lab values.    Today   CHIEF COMPLAINT:   Chief Complaint  Patient presents with  . Fall    HISTORY OF PRESENT ILLNESS:  Deborah Collins  is a 48 y.o. female with a known history of multiple sclerosis, anxiety and depression who presented to the emergency room  following mechanical fall resulting in left lower extremity pain.  Patient sleeps and fell.  Patient did not hit her head.  No loss of consciousness patient was evaluated in the emergency room and had CT scan of the left knee done which revealed comminuted intra-articular fracture of the proximal tibia.  There is extension of nondisplaced component into the medial tibial plateau and proximal metadiaphysis.  Laboratory studies done significant for leukocytosis with white blood cell count of 18,000.  Urinalysis with evidence of UTI patient already seen by tabetic physician physician Dr. Hyacinth MeekerMiller with plans for surgery in a.m.   VITAL SIGNS:  Blood pressure 115/62, pulse 64, temperature 98.2 F (36.8 C), temperature source Oral, resp. rate 20, height 5\' 9"  (1.753 m), weight 63.5 kg, last menstrual period 03/02/2019, SpO2 100 %.  I/O:    Intake/Output Summary (  Last 24 hours) at 03/07/2019 1659 Last data filed at 03/07/2019 1300 Gross per 24 hour  Intake 3217.74 ml  Output 700 ml  Net 2517.74 ml    PHYSICAL EXAMINATION:   GENERAL:  48 y.o.-year-old patient lying in the bed with no acute distress.  EYES: Pupils equal, round, reactive to light and accommodation. No scleral icterus. Extraocular muscles intact.  HEENT: Head atraumatic, normocephalic. Oropharynx and nasopharynx clear.  NECK:  Supple, no jugular venous distention. No thyroid enlargement, no tenderness.  LUNGS: Normal breath sounds bilaterally, no wheezing, rales,rhonchi or crepitation. No use of accessory muscles of respiration.  CARDIOVASCULAR: S1, S2 normal. No murmurs, rubs, or gallops.  ABDOMEN: Soft, nontender, nondistended. Bowel sounds present. No organomegaly or mass.  EXTREMITIES: No pedal edema, cyanosis, or clubbing. LLL in cast and dressing post surgical. NEUROLOGIC: Cranial nerves II through XII are intact. Muscle strength 5/5 in all extremities except LL not moving much - post surgical. Sensation intact. Gait not checked.   PSYCHIATRIC: The patient is alert and oriented x 3.  SKIN: No obvious rash, lesion, or ulcer.   DATA REVIEW:   CBC Recent Labs  Lab 03/07/19 0520  WBC 10.1  HGB 10.3*  HCT 32.1*  PLT 182    Chemistries  Recent Labs  Lab 03/05/19 0516 03/06/19 0449  NA 138 139  K 4.2 3.8  CL 109 111  CO2 23 23  GLUCOSE 125* 118*  BUN 8 6  CREATININE 0.56 0.50  CALCIUM 8.3* 8.0*  MG 2.1  --     Cardiac Enzymes No results for input(s): TROPONINI in the last 168 hours.  Microbiology Results  Results for orders placed or performed during the hospital encounter of 03/04/19  SARS Coronavirus 2 (CEPHEID - Performed in [facility] hospital lab), Hosp Order     Status: None   Collection Time: 03/04/19  4:38 PM  Result Value Ref Range Status   SARS Coronavirus 2 NEGATIVE NEGATIVE Final    Comment: (NOTE) If result is NEGATIVE SARS-CoV-2 target nucleic acids are NOT DETECTED. The SARS-CoV-2 RNA is generally detectable in upper and lower  respiratory specimens during the acute phase of infection. The lowest  concentration of SARS-CoV-2 viral copies this assay can detect is 250  copies / mL. A negative result does not preclude SARS-CoV-2 infection  and should not be used as the sole basis for treatment or other  patient management decisions.  A negative result may occur with  improper specimen collection / handling, submission of specimen other  than nasopharyngeal swab, presence of viral mutation(s) within the  areas targeted by this assay, and inadequate number of viral copies  (<250 copies / mL). A negative result must be combined with clinical  observations, patient history, and epidemiological information. If result is POSITIVE SARS-CoV-2 target nucleic acids are DETECTED. The SARS-CoV-2 RNA is generally detectable in upper and lower  respiratory specimens dur ing the acute phase of infection.  Positive  results are indicative of active infection with SARS-CoV-2.  Clinical   correlation with patient history and other diagnostic information is  necessary to determine patient infection status.  Positive results do  not rule out bacterial infection or co-infection with other viruses. If result is PRESUMPTIVE POSTIVE SARS-CoV-2 nucleic acids MAY BE PRESENT.   A presumptive positive result was obtained on the submitted specimen  and confirmed on repeat testing.  While 2019 novel coronavirus  (SARS-CoV-2) nucleic acids may be present in the submitted sample  additional confirmatory testing may  be necessary for epidemiological  and / or clinical management purposes  to differentiate between  SARS-CoV-2 and other Sarbecovirus currently known to infect humans.  If clinically indicated additional testing with an alternate test  methodology [phone]) is advised. The SARS-CoV-2 RNA is generally  detectable in upper and lower respiratory sp ecimens during the acute  phase of infection. The expected result is Negative. Fact Sheet for Patients:  BoilerBrush.com.cy Fact Sheet for Healthcare Providers: https://pope.com/ This test is not yet approved or cleared by the [place] FDA and has been authorized for detection and/or diagnosis of SARS-CoV-2 by FDA under an Emergency Use Authorization (EUA).  This EUA will remain in effect (meaning this test can be used) for the duration of the COVID-19 declaration under Section 564(b)(1) of the Act, 21 U.S.C. section 360bbb-3(b)(1), unless the authorization is terminated or revoked sooner. Performed at [facility], [address], [place], [place] [address]   Urine Culture     Status: Abnormal   Collection Time: 03/04/19  6:24 PM  Result Value Ref Range Status   Specimen Description   Final    URINE, RANDOM Performed at [facility], [address], [place], [place] [address]    Special Requests   Final    NONE Performed at [facility], [address] [place], [place] [address]    Culture (A)  Final    <10,000 COLONIES/mL INSIGNIFICANT GROWTH Performed at [facility], [address] [place], [place] [address]    Report Status 03/06/2019 FINAL  Final  Surgical pcr screen     Status: None   Collection Time: 03/04/19  9:21 PM  Result Value Ref Range Status   MRSA, PCR NEGATIVE NEGATIVE Final   Staphylococcus aureus NEGATIVE NEGATIVE Final    Comment: (NOTE) The Xpert SA Assay (FDA approved for NASAL specimens in patients 17 years of age and older), is one component of a comprehensive surveillance program. It is not intended to diagnose infection nor to guide or monitor treatment. Performed at [facility], [address] [place], [place] [address]     RADIOLOGY:  No results found.  EKG:  No orders found for this or any previous visit.    Management plans discussed with the patient, family and they are in agreement.  CODE STATUS:     Code Status Orders  (From admission, onward)         Start     Ordered   03/05/19 1623  Full code  Continuous     03/05/19 1622        Code Status History    Date Active Date Inactive Code Status Order ID Comments User Context   03/04/2019 1955 03/05/2019 1622 Full Code [id number]  Jama Flavors, MD ED      TOTAL TIME TAKING CARE OF THIS PATIENT: 35 minutes.    Altamese Dilling M.D on 03/07/2019 at 4:59 PM  Between 7am to 6pm - Pager - [phone]  After 6pm go to [email or web address] - password [employer]  [facility]  Office  [phone]  CC: Primary care physician; Christoper Allegra., MD   Note: This dictation was prepared with Dragon dictation along with smaller phrase technology. Any transcriptional errors that result from this process are unintentional.

## 2019-03-07 NOTE — Progress Notes (Signed)
Physical Therapy Treatment Patient Details Name: Deborah Collins MRN: [id number] DOB: 1970-11-19 Today's Date: 03/07/2019    History of Present Illness Pt is 48 yo female s/p ORIF of left tibia 5/23. PMH of smoking, anxiety, depression, MS    PT Comments    Pt is making good progress with therapy. She is now modified independent for bed mobility. She is able to ambulate to door and back to recliner. Good stability with no external assist required from therapist. Hop to gait pattern with rolling walker. Pt is able to maintain NWB on LLE. Practiced single step with patient and she is able to perform with close CGA from therapist and minA+1 for balance once on the step. She is safe to return home when medically appropriate with supervision from family and HH Pt. Pt will benefit from PT services to address deficits in strength, balance, and mobility in order to return to full function at home.    Follow Up Recommendations  Home health PT;Supervision for mobility/OOB     Equipment Recommendations  Rolling walker with 5" wheels    Recommendations for Other Services       Precautions / Restrictions Precautions Precautions: Fall Required Braces or Orthoses: Knee Immobilizer - Left Knee Immobilizer - Left: On at all times Restrictions Weight Bearing Restrictions: Yes LLE Weight Bearing: Non weight bearing    Mobility  Bed Mobility Overal bed mobility: Modified Independent       Supine to sit: Modified independent (Device/Increase time)     General bed mobility comments: Pt is able to transfer to EOB with HOB elevated and use of bed rails. Slow speed but no external assist from therapist  Transfers Overall transfer level: Needs assistance Equipment used: Rolling walker (2 wheeled) Transfers: Sit to/from Stand Sit to Stand: Min guard         General transfer comment: Safe hand placement demonstrated and fair stability in standing  Ambulation/Gait Ambulation/Gait  assistance: Min guard Gait Distance (Feet): 20 Feet Assistive device: Rolling walker (2 wheeled)       General Gait Details: Pt is able to ambualte to door and back to recliner. Good stability with no external assist required from therapist. Hop to gait pattern with rolling walker. Pt is able to maintain NWB on LLE. Practiced single step with patient and she is able to perform with close CGA from therapist and minA+1 for balance once on the step   Stairs             Wheelchair Mobility    Modified Rankin (Stroke Patients Only)       Balance Overall balance assessment: Needs assistance Sitting-balance support: Feet supported Sitting balance-Leahy Scale: Fair     Standing balance support: Single extremity supported Standing balance-Leahy Scale: Fair                              Cognition Arousal/Alertness: Awake/alert Behavior During Therapy: WFL for tasks assessed/performed Overall Cognitive Status: Within Functional Limits for tasks assessed                                        Exercises      General Comments        Pertinent Vitals/Pain Pain Assessment: 0-10 Pain Score: 4  Pain Location: L knee Pain Descriptors / Indicators: Tender Pain Intervention(s): Monitored during session  Home Living                      Prior Function            PT Goals (current goals can now be found in the care plan section) Acute Rehab PT Goals Patient Stated Goal: to go home PT Goal Formulation: With patient Time For Goal Achievement: 03/20/19 Potential to Achieve Goals: Good Progress towards PT goals: Progressing toward goals    Frequency    BID      PT Plan Current plan remains appropriate    Co-evaluation              AM-PAC PT "6 Clicks" Mobility   Outcome Measure  Help needed turning from your back to your side while in a flat bed without using bedrails?: None Help needed moving from lying on your  back to sitting on the side of a flat bed without using bedrails?: A Little Help needed moving to and from a bed to a chair (including a wheelchair)?: A Little Help needed standing up from a chair using your arms (e.g., wheelchair or bedside chair)?: A Little Help needed to walk in hospital room?: A Little Help needed climbing 3-5 steps with a railing? : A Little 6 Click Score: 19    End of Session Equipment Utilized During Treatment: Gait belt Activity Tolerance: Patient tolerated treatment well Patient left: with SCD's reapplied;with call bell/phone within reach;in bed;with bed alarm set;Other (comment)(Polar care connected, KI donned)   PT Visit Diagnosis: Unsteadiness on feet (R26.81);Muscle weakness (generalized) (M62.81);Difficulty in walking, not elsewhere classified (R26.2);Pain Pain - Right/Left: Left Pain - part of body: Leg     Time: 0920-0952 PT Time Calculation (min) (ACUTE ONLY): 32 min  Charges:  $Gait Training: 8-22 mins $Therapeutic Activity: 8-22 mins                     Heidee Audi D Konya Fauble PT, DPT, GCS    Kellyn Mansfield 03/07/2019, 10:35 AM

## 2019-03-07 NOTE — Progress Notes (Signed)
Discharge instructions reviewed with patient. She verbalizes understanding. Prescription given to her. IV removed. Vitals stable. Patient now waiting on ride to get here so that we can take her out.

## 2019-03-07 NOTE — TOC Transition Note (Signed)
Transition of Care [facility]) - CM/SW Discharge Note   Patient Details  Name: Deborah Collins MRN: [id number] Date of Birth: 30-Jan-1971  [facility] CM/SW Contact:  Darleene Cleaver, LCSW Phone Number: 03/07/2019, 1:47 PM   Clinical Narrative:     Patient will be discharging home with home health through [facility], CSW spoke with Rosey Bath at [facility] she is aware of patient discharging today.  Patient received a front wheel walker delivered to her room.   Final next level of care: Home w Home Health Services Barriers to Discharge: Barriers Resolved   Patient Goals and CMS Choice Patient states their goals for this hospitalization and ongoing recovery are:: to be strong enough to not need help CMS Medicare.gov Compare Post Acute Care list provided to:: Patient Choice offered to / list presented to : Patient  Discharge Placement    Plan to discharge back home with home health.                   Discharge Plan and Services   Discharge Planning Services: CM Consult Post Acute Care Choice: Home Health, Durable Medical Equipment          DME Arranged: Walker rolling DME Agency: [employer] Date DME Agency Contacted: 03/06/19 Time DME Agency Contacted: 1217 Representative spoke with at DME Agency: brad HH Arranged: PT HH Agency: [facility] ([facility]) Date HH Agency Contacted: 03/06/19 Time HH Agency Contacted: 1217 Representative spoke with at [facility] Agency: teresa  Social Determinants of Health (SDOH) Interventions     Readmission Risk Interventions Readmission Risk Prevention Plan 03/06/2019  Post Dischage Appt Complete  Medication Screening Complete  Transportation Screening Complete  Some recent data might be hidden

## 2019-03-08 ENCOUNTER — Encounter: Payer: Self-pay | Admitting: Specialist

## 2019-03-11 DIAGNOSIS — G35 Multiple sclerosis: Secondary | ICD-10-CM | POA: Diagnosis not present

## 2019-03-11 DIAGNOSIS — F418 Other specified anxiety disorders: Secondary | ICD-10-CM | POA: Diagnosis not present

## 2019-03-11 DIAGNOSIS — N390 Urinary tract infection, site not specified: Secondary | ICD-10-CM | POA: Diagnosis not present

## 2019-03-11 DIAGNOSIS — S82142D Displaced bicondylar fracture of left tibia, subsequent encounter for closed fracture with routine healing: Secondary | ICD-10-CM | POA: Diagnosis not present

## 2019-03-11 DIAGNOSIS — Z452 Encounter for adjustment and management of vascular access device: Secondary | ICD-10-CM | POA: Diagnosis not present

## 2019-03-11 DIAGNOSIS — S82142A Displaced bicondylar fracture of left tibia, initial encounter for closed fracture: Secondary | ICD-10-CM | POA: Diagnosis not present

## 2019-03-11 DIAGNOSIS — Z9181 History of falling: Secondary | ICD-10-CM | POA: Diagnosis not present

## 2019-03-14 NOTE — Anesthesia Postprocedure Evaluation (Signed)
Anesthesia Post Note  Patient: Deborah Collins  Procedure(s) Performed: OPEN REDUCTION INTERNAL FIXATION (ORIF) TIBIAL PLATEAU (Left )  Patient location during evaluation: PACU Anesthesia Type: General Level of consciousness: awake and alert Pain management: pain level controlled Vital Signs Assessment: post-procedure vital signs reviewed and stable Respiratory status: spontaneous breathing, nonlabored ventilation, respiratory function stable and patient connected to nasal cannula oxygen Cardiovascular status: blood pressure returned to baseline and stable Postop Assessment: no apparent nausea or vomiting Anesthetic complications: no     Last Vitals:  Vitals:   03/07/19 0334 03/07/19 0818  BP: (!) 112/45 115/62  Pulse: 69 64  Resp: 18 20  Temp: 36.8 C 36.8 C  SpO2: 100% 100%    Last Pain:  Vitals:   03/07/19 0851  TempSrc:   PainSc: 0-No pain                 Yevette Edwards

## 2019-03-16 DIAGNOSIS — S82142A Displaced bicondylar fracture of left tibia, initial encounter for closed fracture: Secondary | ICD-10-CM | POA: Diagnosis not present

## 2019-03-16 DIAGNOSIS — F418 Other specified anxiety disorders: Secondary | ICD-10-CM | POA: Diagnosis not present

## 2019-03-16 DIAGNOSIS — S82142D Displaced bicondylar fracture of left tibia, subsequent encounter for closed fracture with routine healing: Secondary | ICD-10-CM | POA: Diagnosis not present

## 2019-03-16 DIAGNOSIS — Z9181 History of falling: Secondary | ICD-10-CM | POA: Diagnosis not present

## 2019-03-16 DIAGNOSIS — G35 Multiple sclerosis: Secondary | ICD-10-CM | POA: Diagnosis not present

## 2019-03-16 DIAGNOSIS — Z452 Encounter for adjustment and management of vascular access device: Secondary | ICD-10-CM | POA: Diagnosis not present

## 2019-03-16 DIAGNOSIS — N390 Urinary tract infection, site not specified: Secondary | ICD-10-CM | POA: Diagnosis not present

## 2019-03-17 DIAGNOSIS — Z9181 History of falling: Secondary | ICD-10-CM | POA: Diagnosis not present

## 2019-03-17 DIAGNOSIS — S82142D Displaced bicondylar fracture of left tibia, subsequent encounter for closed fracture with routine healing: Secondary | ICD-10-CM | POA: Diagnosis not present

## 2019-03-17 DIAGNOSIS — F418 Other specified anxiety disorders: Secondary | ICD-10-CM | POA: Diagnosis not present

## 2019-03-17 DIAGNOSIS — S82142A Displaced bicondylar fracture of left tibia, initial encounter for closed fracture: Secondary | ICD-10-CM | POA: Diagnosis not present

## 2019-03-17 DIAGNOSIS — G35 Multiple sclerosis: Secondary | ICD-10-CM | POA: Diagnosis not present

## 2019-03-17 DIAGNOSIS — N390 Urinary tract infection, site not specified: Secondary | ICD-10-CM | POA: Diagnosis not present

## 2019-03-17 DIAGNOSIS — Z452 Encounter for adjustment and management of vascular access device: Secondary | ICD-10-CM | POA: Diagnosis not present

## 2019-03-22 DIAGNOSIS — S82112D Displaced fracture of left tibial spine, subsequent encounter for closed fracture with routine healing: Secondary | ICD-10-CM | POA: Diagnosis not present

## 2019-04-11 DIAGNOSIS — S82102A Unspecified fracture of upper end of left tibia, initial encounter for closed fracture: Secondary | ICD-10-CM | POA: Diagnosis not present

## 2019-04-11 DIAGNOSIS — S82112D Displaced fracture of left tibial spine, subsequent encounter for closed fracture with routine healing: Secondary | ICD-10-CM | POA: Diagnosis not present

## 2019-04-21 DIAGNOSIS — Z452 Encounter for adjustment and management of vascular access device: Secondary | ICD-10-CM | POA: Diagnosis not present

## 2019-04-21 DIAGNOSIS — S82142A Displaced bicondylar fracture of left tibia, initial encounter for closed fracture: Secondary | ICD-10-CM | POA: Diagnosis not present

## 2019-04-21 DIAGNOSIS — Z9181 History of falling: Secondary | ICD-10-CM | POA: Diagnosis not present

## 2019-04-21 DIAGNOSIS — S82142D Displaced bicondylar fracture of left tibia, subsequent encounter for closed fracture with routine healing: Secondary | ICD-10-CM | POA: Diagnosis not present

## 2019-04-21 DIAGNOSIS — N390 Urinary tract infection, site not specified: Secondary | ICD-10-CM | POA: Diagnosis not present

## 2019-04-21 DIAGNOSIS — F418 Other specified anxiety disorders: Secondary | ICD-10-CM | POA: Diagnosis not present

## 2019-04-21 DIAGNOSIS — G35 Multiple sclerosis: Secondary | ICD-10-CM | POA: Diagnosis not present

## 2019-04-25 DIAGNOSIS — N390 Urinary tract infection, site not specified: Secondary | ICD-10-CM | POA: Diagnosis not present

## 2019-04-25 DIAGNOSIS — Z452 Encounter for adjustment and management of vascular access device: Secondary | ICD-10-CM | POA: Diagnosis not present

## 2019-04-25 DIAGNOSIS — F418 Other specified anxiety disorders: Secondary | ICD-10-CM | POA: Diagnosis not present

## 2019-04-25 DIAGNOSIS — Z9181 History of falling: Secondary | ICD-10-CM | POA: Diagnosis not present

## 2019-04-25 DIAGNOSIS — S82142A Displaced bicondylar fracture of left tibia, initial encounter for closed fracture: Secondary | ICD-10-CM | POA: Diagnosis not present

## 2019-04-25 DIAGNOSIS — G35 Multiple sclerosis: Secondary | ICD-10-CM | POA: Diagnosis not present

## 2019-04-25 DIAGNOSIS — S82142D Displaced bicondylar fracture of left tibia, subsequent encounter for closed fracture with routine healing: Secondary | ICD-10-CM | POA: Diagnosis not present

## 2019-04-29 DIAGNOSIS — S82102A Unspecified fracture of upper end of left tibia, initial encounter for closed fracture: Secondary | ICD-10-CM | POA: Diagnosis not present

## 2019-05-02 DIAGNOSIS — S82142A Displaced bicondylar fracture of left tibia, initial encounter for closed fracture: Secondary | ICD-10-CM | POA: Diagnosis not present

## 2019-05-02 DIAGNOSIS — S82142D Displaced bicondylar fracture of left tibia, subsequent encounter for closed fracture with routine healing: Secondary | ICD-10-CM | POA: Diagnosis not present

## 2019-05-02 DIAGNOSIS — F418 Other specified anxiety disorders: Secondary | ICD-10-CM | POA: Diagnosis not present

## 2019-05-02 DIAGNOSIS — G35 Multiple sclerosis: Secondary | ICD-10-CM | POA: Diagnosis not present

## 2019-05-02 DIAGNOSIS — Z452 Encounter for adjustment and management of vascular access device: Secondary | ICD-10-CM | POA: Diagnosis not present

## 2019-05-02 DIAGNOSIS — N390 Urinary tract infection, site not specified: Secondary | ICD-10-CM | POA: Diagnosis not present

## 2019-05-02 DIAGNOSIS — Z9181 History of falling: Secondary | ICD-10-CM | POA: Diagnosis not present

## 2019-05-04 DIAGNOSIS — G35 Multiple sclerosis: Secondary | ICD-10-CM | POA: Diagnosis not present

## 2019-05-04 DIAGNOSIS — E538 Deficiency of other specified B group vitamins: Secondary | ICD-10-CM | POA: Diagnosis not present

## 2019-05-04 DIAGNOSIS — F988 Other specified behavioral and emotional disorders with onset usually occurring in childhood and adolescence: Secondary | ICD-10-CM | POA: Diagnosis not present

## 2019-05-04 DIAGNOSIS — D72819 Decreased white blood cell count, unspecified: Secondary | ICD-10-CM | POA: Diagnosis not present

## 2019-05-09 DIAGNOSIS — F418 Other specified anxiety disorders: Secondary | ICD-10-CM | POA: Diagnosis not present

## 2019-05-09 DIAGNOSIS — G35 Multiple sclerosis: Secondary | ICD-10-CM | POA: Diagnosis not present

## 2019-05-09 DIAGNOSIS — S82142D Displaced bicondylar fracture of left tibia, subsequent encounter for closed fracture with routine healing: Secondary | ICD-10-CM | POA: Diagnosis not present

## 2019-05-09 DIAGNOSIS — Z9181 History of falling: Secondary | ICD-10-CM | POA: Diagnosis not present

## 2019-05-11 DIAGNOSIS — Z9181 History of falling: Secondary | ICD-10-CM | POA: Diagnosis not present

## 2019-05-11 DIAGNOSIS — S82142D Displaced bicondylar fracture of left tibia, subsequent encounter for closed fracture with routine healing: Secondary | ICD-10-CM | POA: Diagnosis not present

## 2019-05-11 DIAGNOSIS — G35 Multiple sclerosis: Secondary | ICD-10-CM | POA: Diagnosis not present

## 2019-05-11 DIAGNOSIS — F418 Other specified anxiety disorders: Secondary | ICD-10-CM | POA: Diagnosis not present

## 2019-05-16 DIAGNOSIS — S82142D Displaced bicondylar fracture of left tibia, subsequent encounter for closed fracture with routine healing: Secondary | ICD-10-CM | POA: Diagnosis not present

## 2019-05-16 DIAGNOSIS — G35 Multiple sclerosis: Secondary | ICD-10-CM | POA: Diagnosis not present

## 2019-05-16 DIAGNOSIS — Z9181 History of falling: Secondary | ICD-10-CM | POA: Diagnosis not present

## 2019-05-16 DIAGNOSIS — F418 Other specified anxiety disorders: Secondary | ICD-10-CM | POA: Diagnosis not present

## 2019-05-17 DIAGNOSIS — Z01419 Encounter for gynecological examination (general) (routine) without abnormal findings: Secondary | ICD-10-CM | POA: Diagnosis not present

## 2019-05-18 DIAGNOSIS — Z9181 History of falling: Secondary | ICD-10-CM | POA: Diagnosis not present

## 2019-05-18 DIAGNOSIS — F418 Other specified anxiety disorders: Secondary | ICD-10-CM | POA: Diagnosis not present

## 2019-05-18 DIAGNOSIS — S82142D Displaced bicondylar fracture of left tibia, subsequent encounter for closed fracture with routine healing: Secondary | ICD-10-CM | POA: Diagnosis not present

## 2019-05-18 DIAGNOSIS — G35 Multiple sclerosis: Secondary | ICD-10-CM | POA: Diagnosis not present

## 2019-05-24 DIAGNOSIS — S82142D Displaced bicondylar fracture of left tibia, subsequent encounter for closed fracture with routine healing: Secondary | ICD-10-CM | POA: Diagnosis not present

## 2019-05-24 DIAGNOSIS — F418 Other specified anxiety disorders: Secondary | ICD-10-CM | POA: Diagnosis not present

## 2019-05-24 DIAGNOSIS — G35 Multiple sclerosis: Secondary | ICD-10-CM | POA: Diagnosis not present

## 2019-05-24 DIAGNOSIS — Z9181 History of falling: Secondary | ICD-10-CM | POA: Diagnosis not present

## 2019-05-27 DIAGNOSIS — S82102A Unspecified fracture of upper end of left tibia, initial encounter for closed fracture: Secondary | ICD-10-CM | POA: Diagnosis not present

## 2019-05-31 DIAGNOSIS — S82142D Displaced bicondylar fracture of left tibia, subsequent encounter for closed fracture with routine healing: Secondary | ICD-10-CM | POA: Diagnosis not present

## 2019-05-31 DIAGNOSIS — G35 Multiple sclerosis: Secondary | ICD-10-CM | POA: Diagnosis not present

## 2019-05-31 DIAGNOSIS — F418 Other specified anxiety disorders: Secondary | ICD-10-CM | POA: Diagnosis not present

## 2019-05-31 DIAGNOSIS — Z9181 History of falling: Secondary | ICD-10-CM | POA: Diagnosis not present

## 2019-06-07 DIAGNOSIS — M25562 Pain in left knee: Secondary | ICD-10-CM | POA: Diagnosis not present

## 2019-06-07 DIAGNOSIS — M25662 Stiffness of left knee, not elsewhere classified: Secondary | ICD-10-CM | POA: Diagnosis not present

## 2019-06-10 DIAGNOSIS — M25662 Stiffness of left knee, not elsewhere classified: Secondary | ICD-10-CM | POA: Diagnosis not present

## 2019-06-10 DIAGNOSIS — M25562 Pain in left knee: Secondary | ICD-10-CM | POA: Diagnosis not present

## 2019-06-14 DIAGNOSIS — M25662 Stiffness of left knee, not elsewhere classified: Secondary | ICD-10-CM | POA: Diagnosis not present

## 2019-06-14 DIAGNOSIS — M25562 Pain in left knee: Secondary | ICD-10-CM | POA: Diagnosis not present

## 2019-06-17 DIAGNOSIS — M25562 Pain in left knee: Secondary | ICD-10-CM | POA: Diagnosis not present

## 2019-06-17 DIAGNOSIS — M25662 Stiffness of left knee, not elsewhere classified: Secondary | ICD-10-CM | POA: Diagnosis not present

## 2019-06-21 DIAGNOSIS — M25662 Stiffness of left knee, not elsewhere classified: Secondary | ICD-10-CM | POA: Diagnosis not present

## 2019-06-21 DIAGNOSIS — M25562 Pain in left knee: Secondary | ICD-10-CM | POA: Diagnosis not present

## 2019-06-24 DIAGNOSIS — M25662 Stiffness of left knee, not elsewhere classified: Secondary | ICD-10-CM | POA: Diagnosis not present

## 2019-06-24 DIAGNOSIS — M25562 Pain in left knee: Secondary | ICD-10-CM | POA: Diagnosis not present

## 2019-06-28 DIAGNOSIS — M25662 Stiffness of left knee, not elsewhere classified: Secondary | ICD-10-CM | POA: Diagnosis not present

## 2019-06-28 DIAGNOSIS — M25562 Pain in left knee: Secondary | ICD-10-CM | POA: Diagnosis not present

## 2019-07-01 DIAGNOSIS — M25562 Pain in left knee: Secondary | ICD-10-CM | POA: Diagnosis not present

## 2019-07-01 DIAGNOSIS — M25662 Stiffness of left knee, not elsewhere classified: Secondary | ICD-10-CM | POA: Diagnosis not present

## 2019-07-06 DIAGNOSIS — S82102A Unspecified fracture of upper end of left tibia, initial encounter for closed fracture: Secondary | ICD-10-CM | POA: Diagnosis not present

## 2019-07-11 DIAGNOSIS — M25662 Stiffness of left knee, not elsewhere classified: Secondary | ICD-10-CM | POA: Diagnosis not present

## 2019-07-11 DIAGNOSIS — M25562 Pain in left knee: Secondary | ICD-10-CM | POA: Diagnosis not present

## 2019-07-13 DIAGNOSIS — M25562 Pain in left knee: Secondary | ICD-10-CM | POA: Diagnosis not present

## 2019-07-13 DIAGNOSIS — M25662 Stiffness of left knee, not elsewhere classified: Secondary | ICD-10-CM | POA: Diagnosis not present

## 2019-07-20 DIAGNOSIS — M25662 Stiffness of left knee, not elsewhere classified: Secondary | ICD-10-CM | POA: Diagnosis not present

## 2019-07-20 DIAGNOSIS — M25562 Pain in left knee: Secondary | ICD-10-CM | POA: Diagnosis not present

## 2019-07-22 DIAGNOSIS — M25662 Stiffness of left knee, not elsewhere classified: Secondary | ICD-10-CM | POA: Diagnosis not present

## 2019-07-22 DIAGNOSIS — M25562 Pain in left knee: Secondary | ICD-10-CM | POA: Diagnosis not present

## 2019-07-27 DIAGNOSIS — M25562 Pain in left knee: Secondary | ICD-10-CM | POA: Diagnosis not present

## 2019-07-27 DIAGNOSIS — M25662 Stiffness of left knee, not elsewhere classified: Secondary | ICD-10-CM | POA: Diagnosis not present

## 2019-08-03 DIAGNOSIS — M25562 Pain in left knee: Secondary | ICD-10-CM | POA: Diagnosis not present

## 2019-08-03 DIAGNOSIS — M25662 Stiffness of left knee, not elsewhere classified: Secondary | ICD-10-CM | POA: Diagnosis not present

## 2019-08-05 DIAGNOSIS — M25662 Stiffness of left knee, not elsewhere classified: Secondary | ICD-10-CM | POA: Diagnosis not present

## 2019-08-05 DIAGNOSIS — M25562 Pain in left knee: Secondary | ICD-10-CM | POA: Diagnosis not present

## 2019-08-08 DIAGNOSIS — M25562 Pain in left knee: Secondary | ICD-10-CM | POA: Diagnosis not present

## 2019-08-08 DIAGNOSIS — M25662 Stiffness of left knee, not elsewhere classified: Secondary | ICD-10-CM | POA: Diagnosis not present

## 2019-08-17 DIAGNOSIS — E538 Deficiency of other specified B group vitamins: Secondary | ICD-10-CM | POA: Diagnosis not present

## 2019-08-17 DIAGNOSIS — D72819 Decreased white blood cell count, unspecified: Secondary | ICD-10-CM | POA: Diagnosis not present

## 2019-08-17 DIAGNOSIS — Z23 Encounter for immunization: Secondary | ICD-10-CM | POA: Diagnosis not present

## 2019-08-17 DIAGNOSIS — G35 Multiple sclerosis: Secondary | ICD-10-CM | POA: Diagnosis not present

## 2019-08-17 DIAGNOSIS — F988 Other specified behavioral and emotional disorders with onset usually occurring in childhood and adolescence: Secondary | ICD-10-CM | POA: Diagnosis not present

## 2019-08-19 DIAGNOSIS — M25662 Stiffness of left knee, not elsewhere classified: Secondary | ICD-10-CM | POA: Diagnosis not present

## 2019-08-19 DIAGNOSIS — M25562 Pain in left knee: Secondary | ICD-10-CM | POA: Diagnosis not present

## 2019-08-24 DIAGNOSIS — M25562 Pain in left knee: Secondary | ICD-10-CM | POA: Diagnosis not present

## 2019-08-24 DIAGNOSIS — M25662 Stiffness of left knee, not elsewhere classified: Secondary | ICD-10-CM | POA: Diagnosis not present

## 2019-08-29 DIAGNOSIS — S82301A Unspecified fracture of lower end of right tibia, initial encounter for closed fracture: Secondary | ICD-10-CM | POA: Diagnosis not present

## 2019-09-07 DIAGNOSIS — S82301A Unspecified fracture of lower end of right tibia, initial encounter for closed fracture: Secondary | ICD-10-CM | POA: Diagnosis not present

## 2019-09-07 DIAGNOSIS — S82102A Unspecified fracture of upper end of left tibia, initial encounter for closed fracture: Secondary | ICD-10-CM | POA: Diagnosis not present

## 2019-09-16 DIAGNOSIS — S82301A Unspecified fracture of lower end of right tibia, initial encounter for closed fracture: Secondary | ICD-10-CM | POA: Diagnosis not present

## 2019-09-26 DIAGNOSIS — S82301A Unspecified fracture of lower end of right tibia, initial encounter for closed fracture: Secondary | ICD-10-CM | POA: Diagnosis not present

## 2019-10-10 DIAGNOSIS — S93421A Sprain of deltoid ligament of right ankle, initial encounter: Secondary | ICD-10-CM | POA: Diagnosis not present

## 2019-10-10 DIAGNOSIS — S93491A Sprain of other ligament of right ankle, initial encounter: Secondary | ICD-10-CM | POA: Diagnosis not present

## 2019-10-10 DIAGNOSIS — S82301A Unspecified fracture of lower end of right tibia, initial encounter for closed fracture: Secondary | ICD-10-CM | POA: Diagnosis not present

## 2019-10-10 DIAGNOSIS — S93422A Sprain of deltoid ligament of left ankle, initial encounter: Secondary | ICD-10-CM | POA: Diagnosis not present

## 2019-11-07 DIAGNOSIS — S82301A Unspecified fracture of lower end of right tibia, initial encounter for closed fracture: Secondary | ICD-10-CM | POA: Diagnosis not present

## 2019-11-09 DIAGNOSIS — D72819 Decreased white blood cell count, unspecified: Secondary | ICD-10-CM | POA: Diagnosis not present

## 2019-11-09 DIAGNOSIS — G35 Multiple sclerosis: Secondary | ICD-10-CM | POA: Diagnosis not present

## 2019-11-25 DIAGNOSIS — E559 Vitamin D deficiency, unspecified: Secondary | ICD-10-CM | POA: Diagnosis not present

## 2019-11-25 DIAGNOSIS — G35 Multiple sclerosis: Secondary | ICD-10-CM | POA: Diagnosis not present

## 2019-11-25 DIAGNOSIS — D72819 Decreased white blood cell count, unspecified: Secondary | ICD-10-CM | POA: Diagnosis not present

## 2019-11-25 DIAGNOSIS — N319 Neuromuscular dysfunction of bladder, unspecified: Secondary | ICD-10-CM | POA: Diagnosis not present

## 2019-12-05 DIAGNOSIS — L719 Rosacea, unspecified: Secondary | ICD-10-CM | POA: Diagnosis not present

## 2019-12-05 DIAGNOSIS — L239 Allergic contact dermatitis, unspecified cause: Secondary | ICD-10-CM | POA: Diagnosis not present

## 2019-12-12 DIAGNOSIS — S82301A Unspecified fracture of lower end of right tibia, initial encounter for closed fracture: Secondary | ICD-10-CM | POA: Diagnosis not present

## 2019-12-19 DIAGNOSIS — L239 Allergic contact dermatitis, unspecified cause: Secondary | ICD-10-CM | POA: Diagnosis not present

## 2019-12-19 DIAGNOSIS — L719 Rosacea, unspecified: Secondary | ICD-10-CM | POA: Diagnosis not present

## 2019-12-19 DIAGNOSIS — L309 Dermatitis, unspecified: Secondary | ICD-10-CM | POA: Diagnosis not present

## 2019-12-19 DIAGNOSIS — D485 Neoplasm of uncertain behavior of skin: Secondary | ICD-10-CM | POA: Diagnosis not present

## 2019-12-26 ENCOUNTER — Other Ambulatory Visit: Payer: Self-pay

## 2019-12-26 ENCOUNTER — Ambulatory Visit (INDEPENDENT_AMBULATORY_CARE_PROVIDER_SITE_OTHER): Payer: Federal, State, Local not specified - PPO

## 2019-12-26 DIAGNOSIS — L308 Other specified dermatitis: Secondary | ICD-10-CM | POA: Diagnosis not present

## 2019-12-26 DIAGNOSIS — L2489 Irritant contact dermatitis due to other agents: Secondary | ICD-10-CM | POA: Diagnosis not present

## 2019-12-26 DIAGNOSIS — L719 Rosacea, unspecified: Secondary | ICD-10-CM | POA: Diagnosis not present

## 2019-12-26 MED ORDER — EUCRISA 2 % EX OINT: 1.0000 "application " | TOPICAL_OINTMENT | Freq: Two times a day (BID) | CUTANEOUS | 3 refills | Status: DC

## 2019-12-26 NOTE — Progress Notes (Deleted)
   Follow-Up Visit   Subjective  Deborah Collins is a 49 y.o. female who presents for the following: Suture / Staple Removal (biopsy proven eczema vrs drug rxn). Post-operative visit She is here for a post-operative check. Patient here to discuss biopsy results and treatment plan.  She  The following portions of the chart were reviewed this encounter and updated as appropriate:     Review of Systems: Pertinent items are noted in HPI.  Objective  Well appearing patient in no apparent distress; mood and affect are within normal limits.  A focused examination was performed including arms and legs . Relevant physical exam findings are noted in the Assessment and Plan.  Objective  right anterior ankle: Pink scaly patch at R ant ankle  Start Eucrisa ointment apply to skin daily   Objective  Right wrist, left flexor forearm: Ill-defined pink papules/plaques with scale-crust  Start Eucrisa ointment bid  Cont Clobetasol cream prn flares   Objective  face: Mid face erythema with telangiectasias +/- scattered inflammatory papules.  Pt using Soolantra qhs and Oracea 1 tablet daily    Assessment & Plan  Irritant contact dermatitis due to other agents right anterior ankle Crisaborole (EUCRISA) 2 % OINT apply qd-bid   Other eczema Right wrist, left flexor forearm  Crisaborole (EUCRISA) 2 % OINT apply qd-bid - Right wrist, left flexor forearm  Rosacea face Pt using Soolantra qhs and Oracea 1 tablet daily   Return in about 6 months (around 06/27/2020) for Eczema .

## 2019-12-27 NOTE — Progress Notes (Signed)
   Follow-Up Visit   Subjective  Deborah Collins is a 49 y.o. female who presents for the following: Suture / Staple Removal (biopsy proven eczema vrs drug rxn). Post-operative visit She is here for a post-operative check.  She has been experiencing no problems.    The following portions of the chart were reviewed this encounter and updated as appropriate:     Review of Systems: No other skin or systemic complaints.  Objective  Well appearing patient in no apparent distress; mood and affect are within normal limits.  A focused examination was performed including arms, R lower leg, face. Relevant physical exam findings are noted in the Assessment and Plan.  Objective  right anterior ankle: Pink scaly patch at R ant ankle - improved  Objective  BL forearms: Dusky pink patches without scale   Objective  face: Mid face erythema with telangiectasias +/- scattered inflammatory papules. Improvement on medication.   Assessment & Plan  Irritant contact dermatitis due to other agents right anterior ankle  Start Eucrisa ointment apply to skin 1-2 times daily   Other eczema BL forearms  Start Eucrisa ointment bid Cont Clobetesol cream bid prn flares  Ordered Medications: Crisaborole (EUCRISA) 2 % OINT  Rosacea face  Pt to continue Soolantra qhs and Oracea 40 mg tablet daily

## 2020-03-28 DIAGNOSIS — E538 Deficiency of other specified B group vitamins: Secondary | ICD-10-CM | POA: Diagnosis not present

## 2020-03-28 DIAGNOSIS — D72819 Decreased white blood cell count, unspecified: Secondary | ICD-10-CM | POA: Diagnosis not present

## 2020-03-28 DIAGNOSIS — F988 Other specified behavioral and emotional disorders with onset usually occurring in childhood and adolescence: Secondary | ICD-10-CM | POA: Diagnosis not present

## 2020-03-28 DIAGNOSIS — G35 Multiple sclerosis: Secondary | ICD-10-CM | POA: Diagnosis not present

## 2020-03-28 DIAGNOSIS — N319 Neuromuscular dysfunction of bladder, unspecified: Secondary | ICD-10-CM | POA: Diagnosis not present

## 2020-04-19 IMAGING — CT CT OF THE LEFT KNEE WITHOUT CONTRAST
3 of 4 series · 13 of 33 positions shown, 16 images · non-contrast
Comparison: Radiographs same date

CLINICAL DATA: Fall today. Comminuted fracture of the proximal
tibia.

EXAM:
CT OF THE LEFT KNEE WITHOUT CONTRAST
TECHNIQUE: Multidetector CT imaging of the left knee was performed according to
the standard protocol. Multiplanar CT image reconstructions were
also generated.

[Series 5: cor bone · coronal · 0.29mm/px · 1 of 139 slices shown]
[im 70/139  bone]
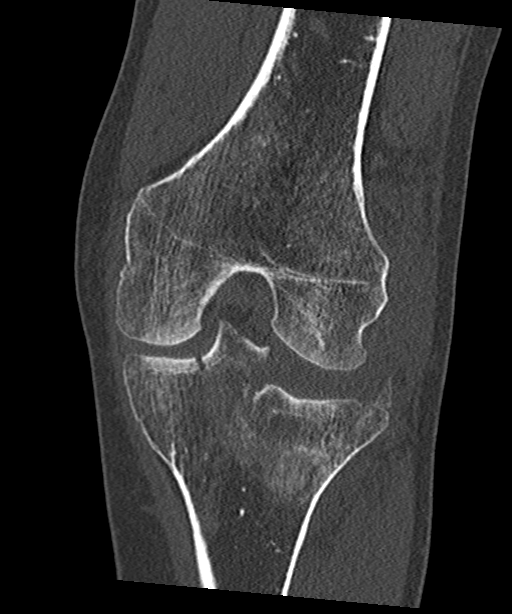

[Series 7: axial st · axial · 0.29mm/px · z∈[-294,-144]mm · 7 of 180 slices shown, 9 images]
[im 14/180  soft-tissue]
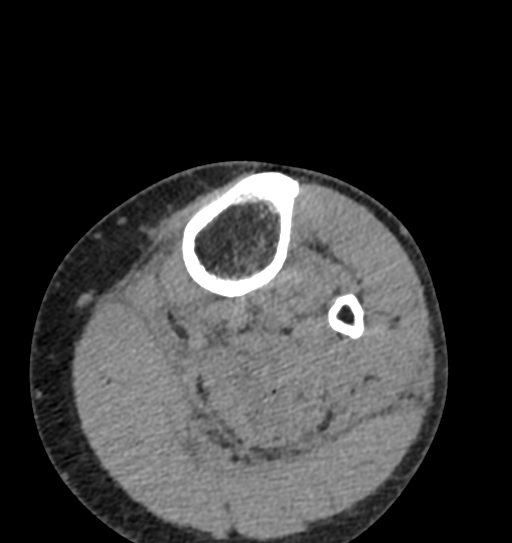
[im 14/180  bone]
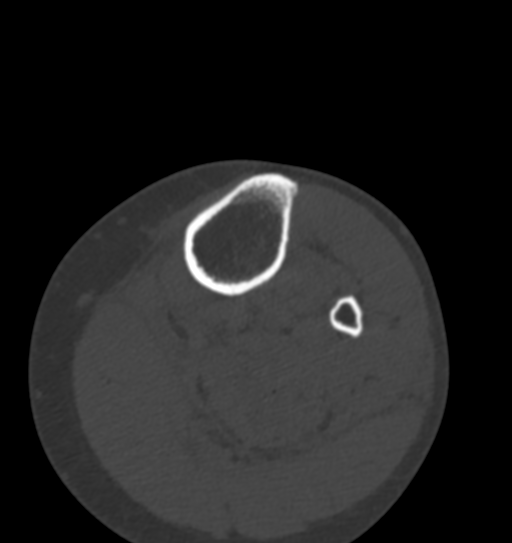
[im 42/180  bone]
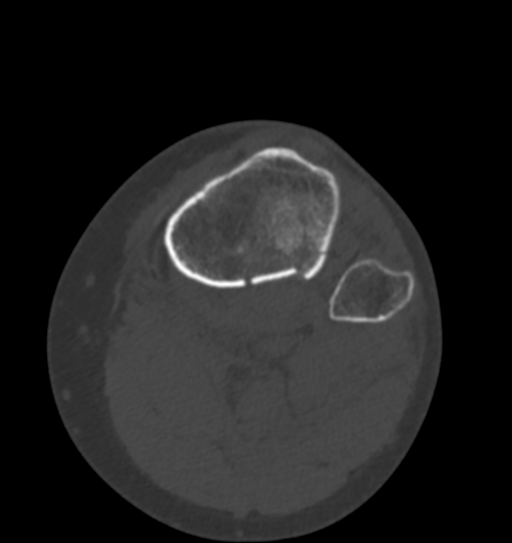
[im 69/180  bone]
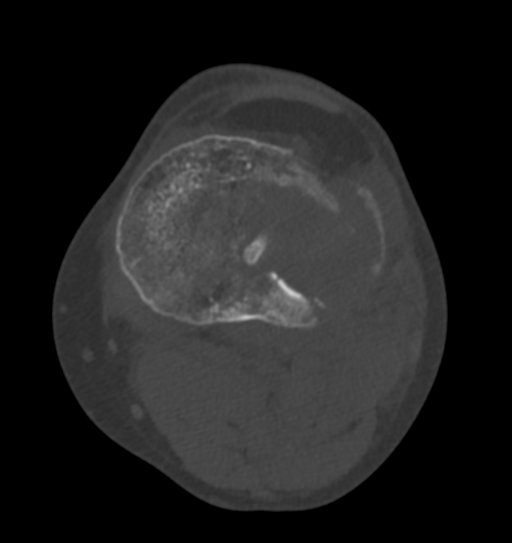
[im 97/180  bone]
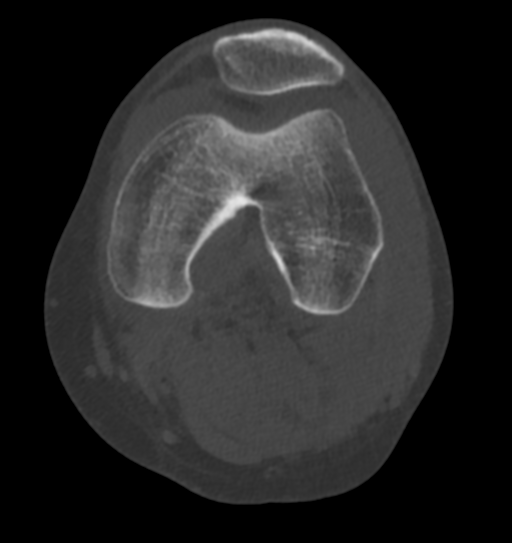
[im 111/180  soft-tissue]
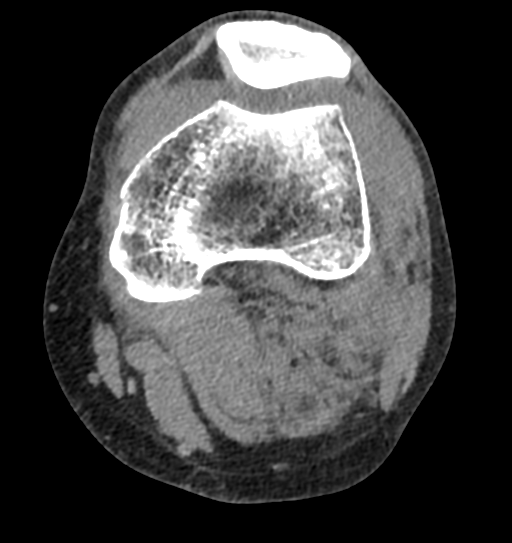
[im 111/180  bone]
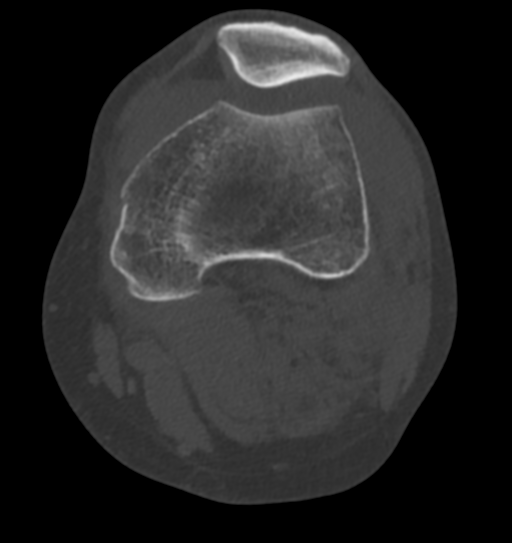
[im 138/180  bone]
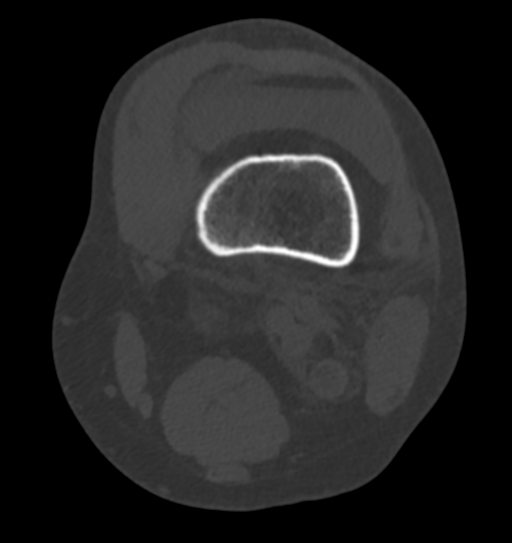
[im 166/180  bone]
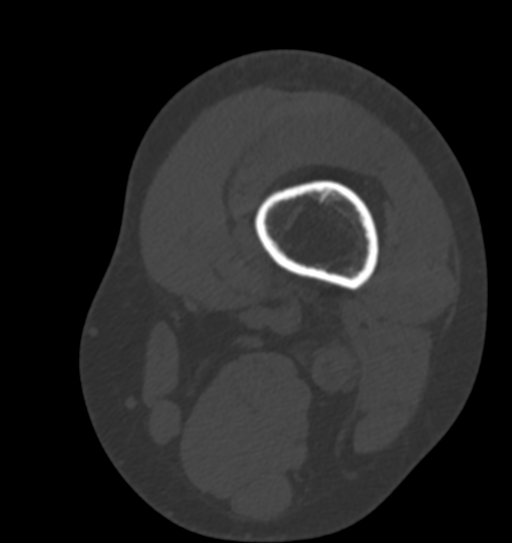

[Series 9: sag st · sagittal · 0.27mm/px · 5 of 139 slices shown, 6 images]
[im 47/139  bone]
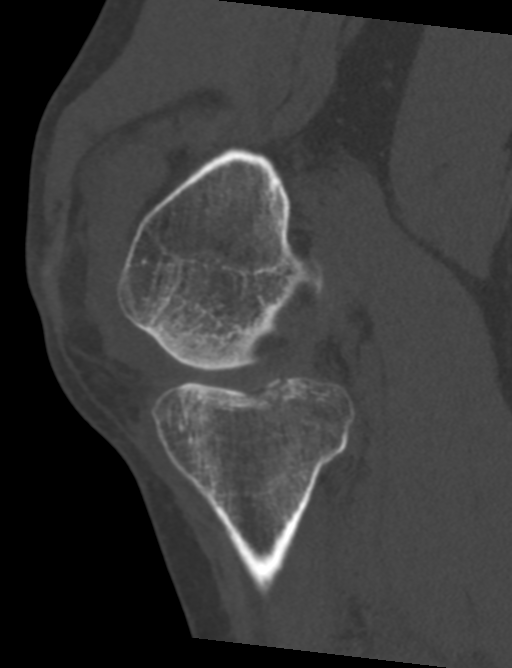
[im 58/139  bone]
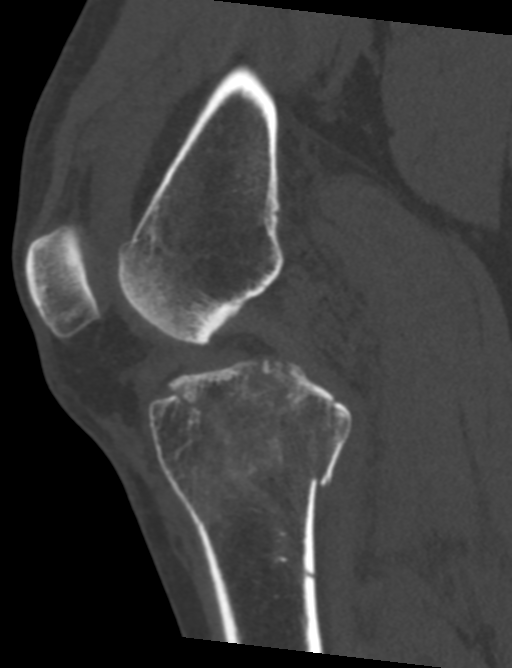
[im 70/139  soft-tissue]
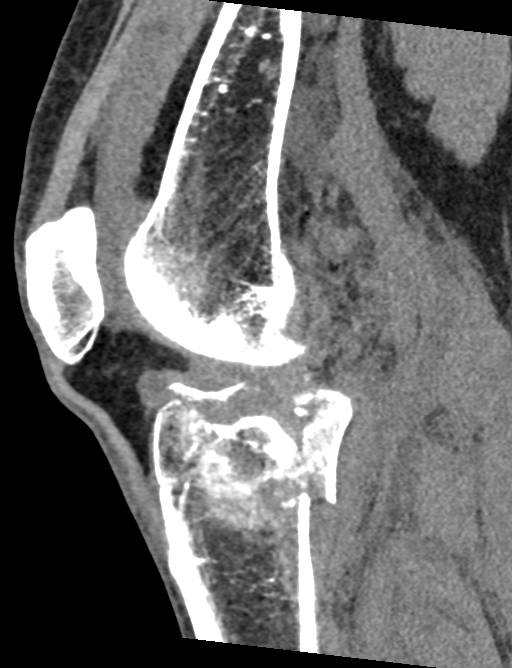
[im 70/139  bone]
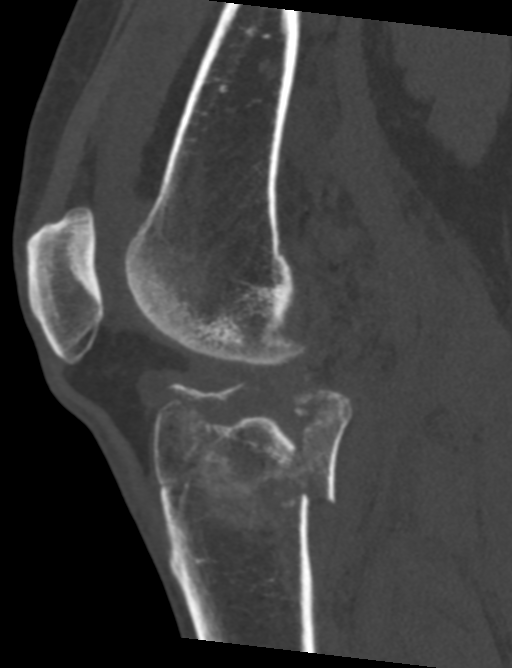
[im 81/139  bone]
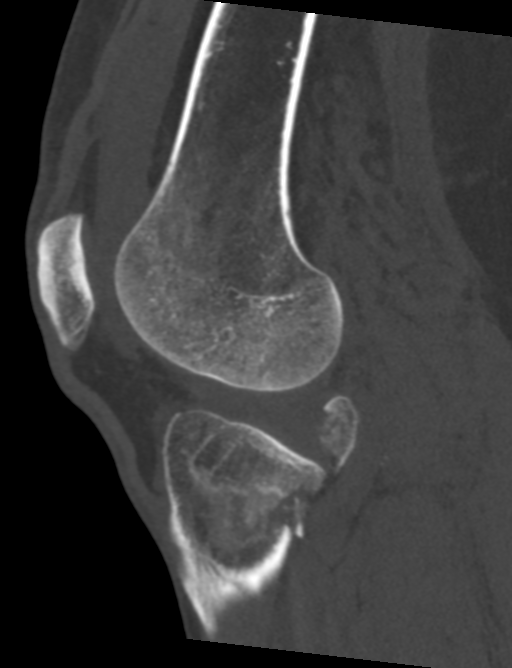
[im 93/139  bone]
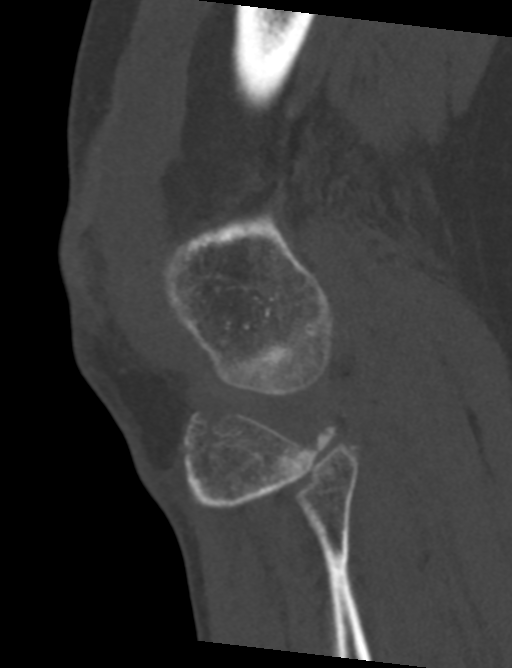

[13 of 33 positions shown; findings below may reference images not displayed]

FINDINGS: Bones/Joint/Cartilage

There is an acute, extensively comminuted intra-articular fracture
of the proximal tibia. There is significant depression of the
lateral tibial plateau posteriorly by approximately 1.7 cm. There is
posterior displacement of the metaphyseal cortex by 8 mm on sagittal
image 74/6. There are nondisplaced fractures extending through the
central tibia into the posterior aspect of the medial tibial
plateau. Fractures extend into the proximal metadiaphysis. Both
tibial spines are undermined.

The fibular head appears intact and normally located. The distal
femur and patella are intact. There is a large lipohemarthrosis.

Ligaments

Suboptimally assessed by CT.  The cruciate ligaments appear intact.

Muscles and Tendons

The extensor mechanism is intact.  No focal muscular hematoma.

Soft tissues

There is soft tissue swelling around the knee, especially in the
popliteal fossa. Some of this may be secondary to capsular
extravasation. No focal extra-articular hematoma demonstrated.
IMPRESSION: 1. Comminuted intra-articular fracture of the proximal tibia as
described. This fracture has a significantly depressed component
involving the lateral tibial plateau. There is extension of
nondisplaced components into the medial tibial plateau and proximal
metadiaphysis.
2. The distal femur, patella and proximal fibula appear intact.
3. Large hemarthrosis with soft tissue edema in the popliteal fossa.

## 2020-04-20 ENCOUNTER — Other Ambulatory Visit: Payer: Self-pay | Admitting: Dermatology

## 2020-05-09 DIAGNOSIS — D72819 Decreased white blood cell count, unspecified: Secondary | ICD-10-CM | POA: Diagnosis not present

## 2020-05-09 DIAGNOSIS — G35 Multiple sclerosis: Secondary | ICD-10-CM | POA: Diagnosis not present

## 2020-05-30 DIAGNOSIS — Z01419 Encounter for gynecological examination (general) (routine) without abnormal findings: Secondary | ICD-10-CM | POA: Diagnosis not present

## 2020-06-22 ENCOUNTER — Other Ambulatory Visit: Payer: Self-pay | Admitting: Dermatology

## 2020-07-03 ENCOUNTER — Other Ambulatory Visit: Payer: Self-pay

## 2020-07-03 ENCOUNTER — Ambulatory Visit: Payer: Federal, State, Local not specified - PPO | Admitting: Dermatology

## 2020-07-03 DIAGNOSIS — L308 Other specified dermatitis: Secondary | ICD-10-CM

## 2020-07-03 DIAGNOSIS — L719 Rosacea, unspecified: Secondary | ICD-10-CM | POA: Diagnosis not present

## 2020-07-03 MED ORDER — EUCRISA 2 % EX OINT: 1.0000 "application " | TOPICAL_OINTMENT | Freq: Two times a day (BID) | CUTANEOUS | 3 refills | Status: DC

## 2020-07-03 NOTE — Progress Notes (Signed)
   Follow-Up Visit   Subjective  Deborah Collins is a 49 y.o. female who presents for the following: Rosacea (Much improved with ivermectin cream) and Eczema (Improved with Eucrisa Ointment. Patient also has clobetasol cream, but not currently using.).   The following portions of the chart were reviewed this encounter and updated as appropriate:      Review of Systems:  No other skin or systemic complaints except as noted in HPI or Assessment and Plan.  Objective  Well appearing patient in no apparent distress; mood and affect are within normal limits.  A focused examination was performed including face, arms. Relevant physical exam findings are noted in the Assessment and Plan.  Objective  Face: Mild erythema on nose and malar cheeks.  Objective  Arms: Clear today.   Assessment & Plan  Rosacea Face  Improved.  Continue ivermectin cream Apply to face qhs.  May restart Oracea prn flares.  Recommend daily broad spectrum sunscreen SPF 30+ to sun-exposed areas, reapply every 2 hours as needed.   Other eczema Arms  Biopsy proven Perivascular Dermatitis with Eosinophils  Improved.  Continue Eucrisa Ointment qd/bid Aas prn Continue Clobetasol Cream qd/bid prn flares. Avoid face, groin, axilla. Avoid long term use- risk skin atrophy  Topical steroids (such as triamcinolone, fluocinolone, fluocinonide, mometasone, clobetasol, halobetasol, betamethasone, hydrocortisone) can cause thinning and lightening of the skin if they are used for too long in the same area. Your physician has selected the right strength medicine for your problem and area affected on the body. Please use your medication only as directed by your physician to prevent side effects.     Reordered Medications Crisaborole (EUCRISA) 2 % OINT  Return in about 1 year (around 07/03/2021) for Eczema/rosacea f/u.Wendee Beavers, CMA, am acting as scribe for Willeen Niece, MD .  Documentation: I have  reviewed the above documentation for accuracy and completeness, and I agree with the above.  Willeen Niece MD

## 2020-07-03 NOTE — Patient Instructions (Signed)
Rosacea  What is rosacea? Rosacea (say: ro-zay-sha) is a common skin disease that usually begins as a trend of flushing or blushing easily.  As rosacea progresses, a persistent redness in the center of the face will develop and may gradually spread beyond the nose and cheeks to the forehead and chin.  In some cases, the ears, chest, and back could be affected.  Rosacea may appear as tiny blood vessels or small red bumps that occur in crops.  Frequently they can contain pus, and are called "pustules".  If the bumps do not contain pus, they are referred to as "papules".  Rarely, in prolonged, untreated cases of rosacea, the oil glands of the nose and cheeks may become permanently enlarged.  This is called rhinophyma, and is seen more frequently in men.  Signs and Risks In its beginning stages, rosacea tends to come and go, which makes it difficult to recognize.  It can start as intermittent flushing of the face.  Eventually, blood vessels may become permanently visible.  Pustules and papules can appear, but can be mistaken for adult acne.  People of all races, ages, genders and ethnic groups are at risk of developing rosacea.  However, it is more common in women (especially around menopause) and adults with fair skin between the ages of 30 and 50.  Treatment Dermatologists typically recommend a combination of treatments to effectively manage rosacea.  Treatment can improve symptoms and may stop the progression of the rosacea.  Treatment may involve both topical and oral medications.  The tetracycline antibiotics are often used for their anti-inflammatory effect; however, because of the possibility of developing antibiotic resistance, they should not be used long term at full dose.  For dilated blood vessels the options include electrodessication (uses electric current through a small needle), laser treatment, and cosmetics to hide the redness.   With all forms of treatment, improvement is a slow process, and  patients may not see any results for the first 3-4 weeks.  It is very important to avoid the sun and other triggers.  Patients must wear sunscreen daily.  Skin Care Instructions: 1. Cleanse the skin with a mild soap such as CeraVe cleanser, Cetaphil cleanser, or Dove soap once or twice daily as needed. 2. Moisturize with Eucerin Redness Relief Daily Perfecting Lotion (has a subtle green tint), CeraVe Moisturizing Cream, or Oil of Olay Daily Moisturizer with sunscreen every morning and/or night as recommended. 3. Makeup should be "non-comedogenic" (won't clog pores) and be labeled "for sensitive skin". Good choices for cosmetics are: Neutrogena, Almay, and Physician's Formula.  Any product with a green tint tends to offset a red complexion. 4. If your eyes are dry and irritated, use artificial tears 2-3 times per day and cleanse the eyelids daily with baby shampoo.  Have your eyes examined at least every 2 years.  Be sure to tell your eye doctor that you have rosacea. 5. Alcoholic beverages tend to cause flushing of the skin, and may make rosacea worse. 6. Always wear sunscreen, protect your skin from extreme hot and cold temperatures, and avoid spicy foods, hot drinks, and mechanical irritation such as rubbing, scrubbing, or massaging the face.  Avoid harsh skin cleansers, cleansing masks, astringents, and exfoliation. If a particular product burns or makes your face feel tight, then it is likely to flare your rosacea. 7. If you are having difficulty finding a sunscreen that you can tolerate, you may try switching to a chemical-free sunscreen.  These are ones whose active   ingredient is zinc oxide or titanium dioxide only.  They should also be fragrance free, non-comedogenic, and labeled for sensitive skin. 8. Rosacea triggers may vary from person to person.  There are a variety of foods that have been reported to trigger rosacea.  Some patients find that keeping a diary of what they were doing when they  flared helps them avoid triggers.  

## 2020-07-04 ENCOUNTER — Telehealth: Payer: Self-pay

## 2020-07-04 NOTE — Telephone Encounter (Signed)
Pam Drown was denied by insurance.

## 2020-07-04 NOTE — Telephone Encounter (Signed)
I know she prefers [place] [facility] and it works well for her.  Can we send it to [facility] or [facility] and see if she can use the rebate coupon?  I believe she got it before from one of those pharmacies, so you can ask her which one and send it there.

## 2020-07-06 DIAGNOSIS — D849 Immunodeficiency, unspecified: Secondary | ICD-10-CM | POA: Diagnosis not present

## 2020-07-06 DIAGNOSIS — N319 Neuromuscular dysfunction of bladder, unspecified: Secondary | ICD-10-CM | POA: Diagnosis not present

## 2020-07-06 DIAGNOSIS — G35 Multiple sclerosis: Secondary | ICD-10-CM | POA: Diagnosis not present

## 2020-07-06 DIAGNOSIS — F988 Other specified behavioral and emotional disorders with onset usually occurring in childhood and adolescence: Secondary | ICD-10-CM | POA: Diagnosis not present

## 2020-08-22 ENCOUNTER — Other Ambulatory Visit: Payer: Self-pay | Admitting: Dermatology

## 2020-10-23 ENCOUNTER — Ambulatory Visit: Payer: Federal, State, Local not specified - PPO | Admitting: Dermatology

## 2020-10-23 ENCOUNTER — Other Ambulatory Visit: Payer: Self-pay

## 2020-10-23 DIAGNOSIS — L309 Dermatitis, unspecified: Secondary | ICD-10-CM

## 2020-10-23 DIAGNOSIS — L719 Rosacea, unspecified: Secondary | ICD-10-CM | POA: Diagnosis not present

## 2020-10-23 MED ORDER — MOMETASONE FUROATE 0.1 % EX CREA: 1.0000 "application " | TOPICAL_CREAM | CUTANEOUS | 1 refills | Status: DC

## 2020-10-23 MED ORDER — EUCRISA 2 % EX OINT: 1.0000 "application " | TOPICAL_OINTMENT | CUTANEOUS | 1 refills | Status: DC

## 2020-10-23 MED ORDER — DOXYCYCLINE 40 MG PO CPDR: 40.0000 mg | DELAYED_RELEASE_CAPSULE | ORAL | 3 refills | Status: DC

## 2020-10-23 NOTE — Progress Notes (Signed)
   Follow-Up Visit   Subjective  Deborah Collins is a 50 y.o. female who presents for the following: Rash (Scalp and face, ~5 months, itchy, had some oozing recently, coconut oil, tried Nizoral shampoo and it dried hair out/Pt has MS and gets ocrevus infusion q 22m/No new products).  Infusion is scheduled for next week.  Had similar rash come up on her arms last week, but it has resolved.  Previously biopsied as Perivascular dermatitis.   The following portions of the chart were reviewed this encounter and updated as appropriate:       Review of Systems:  No other skin or systemic complaints except as noted in HPI or Assessment and Plan.  Objective  Well appearing patient in no apparent distress; mood and affect are within normal limits.  A focused examination was performed including scalp. Relevant physical exam findings are noted in the Assessment and Plan.  Objective  face, arms: Edematous scaly plaques forehead, temples  Objective  face: Erythema with few resolving inflammatory paps nose and malar cheeks   Assessment & Plan  Dermatitis face, arms  With flare, unclear etiology Start Mometasone cr qd/bid aa face up to 2 weeks until clear Start Eucrisa oint qd/bid aa rash on face,body until clear, then prn flares Samples of Vanicream cleanser, moisturizer, anti itch cream, ointment  mometasone (ELOCON) 0.1 % cream - face, arms  Crisaborole (EUCRISA) 2 % OINT - face, arms  Rosacea face  Chronic, improved but not at goal  Cont topical Ivermectin cr qhs Restart Oracea 40mg  1 po qd with food and drink  Rosacea is a chronic progressive skin condition usually affecting the face of adults, causing redness and/or acne bumps. It is treatable but not curable. It sometimes affects the eyes (ocular rosacea) as well. It may respond to topical and/or systemic medication and can flare with stress, sun exposure, alcohol, exercise and some foods.  Daily application of broad spectrum  spf 30+ sunscreen to face is recommended to reduce flares.   doxycycline (ORACEA) 40 MG capsule - face  Return for as scheduled.  I, , RMA, am acting as scribe for Ardis Rowan, MD . Documentation: I have reviewed the above documentation for accuracy and completeness, and I agree with the above.  Willeen Niece MD

## 2020-11-07 DIAGNOSIS — N319 Neuromuscular dysfunction of bladder, unspecified: Secondary | ICD-10-CM | POA: Diagnosis not present

## 2020-11-07 DIAGNOSIS — D72819 Decreased white blood cell count, unspecified: Secondary | ICD-10-CM | POA: Diagnosis not present

## 2020-11-07 DIAGNOSIS — F988 Other specified behavioral and emotional disorders with onset usually occurring in childhood and adolescence: Secondary | ICD-10-CM | POA: Diagnosis not present

## 2020-11-07 DIAGNOSIS — G35 Multiple sclerosis: Secondary | ICD-10-CM | POA: Diagnosis not present

## 2020-11-07 DIAGNOSIS — D849 Immunodeficiency, unspecified: Secondary | ICD-10-CM | POA: Diagnosis not present

## 2020-11-07 DIAGNOSIS — E559 Vitamin D deficiency, unspecified: Secondary | ICD-10-CM | POA: Diagnosis not present

## 2021-03-01 ENCOUNTER — Other Ambulatory Visit: Payer: Self-pay | Admitting: Dermatology

## 2021-03-01 DIAGNOSIS — L719 Rosacea, unspecified: Secondary | ICD-10-CM

## 2021-05-08 DIAGNOSIS — D72819 Decreased white blood cell count, unspecified: Secondary | ICD-10-CM | POA: Diagnosis not present

## 2021-05-08 DIAGNOSIS — N319 Neuromuscular dysfunction of bladder, unspecified: Secondary | ICD-10-CM | POA: Diagnosis not present

## 2021-05-08 DIAGNOSIS — F988 Other specified behavioral and emotional disorders with onset usually occurring in childhood and adolescence: Secondary | ICD-10-CM | POA: Diagnosis not present

## 2021-05-08 DIAGNOSIS — Z79899 Other long term (current) drug therapy: Secondary | ICD-10-CM | POA: Diagnosis not present

## 2021-05-08 DIAGNOSIS — G35 Multiple sclerosis: Secondary | ICD-10-CM | POA: Diagnosis not present

## 2021-05-08 DIAGNOSIS — D849 Immunodeficiency, unspecified: Secondary | ICD-10-CM | POA: Diagnosis not present

## 2021-05-27 ENCOUNTER — Other Ambulatory Visit: Payer: Self-pay | Admitting: Dermatology

## 2021-05-27 DIAGNOSIS — L719 Rosacea, unspecified: Secondary | ICD-10-CM

## 2021-06-05 DIAGNOSIS — R87610 Atypical squamous cells of undetermined significance on cytologic smear of cervix (ASC-US): Secondary | ICD-10-CM | POA: Diagnosis not present

## 2021-06-05 DIAGNOSIS — Z1159 Encounter for screening for other viral diseases: Secondary | ICD-10-CM | POA: Diagnosis not present

## 2021-06-05 DIAGNOSIS — N871 Moderate cervical dysplasia: Secondary | ICD-10-CM | POA: Diagnosis not present

## 2021-06-05 DIAGNOSIS — Z01419 Encounter for gynecological examination (general) (routine) without abnormal findings: Secondary | ICD-10-CM | POA: Diagnosis not present

## 2021-07-09 ENCOUNTER — Other Ambulatory Visit: Payer: Self-pay

## 2021-07-09 ENCOUNTER — Ambulatory Visit (INDEPENDENT_AMBULATORY_CARE_PROVIDER_SITE_OTHER): Payer: Federal, State, Local not specified - PPO | Admitting: Dermatology

## 2021-07-09 DIAGNOSIS — L719 Rosacea, unspecified: Secondary | ICD-10-CM | POA: Diagnosis not present

## 2021-07-09 DIAGNOSIS — W57XXXA Bitten or stung by nonvenomous insect and other nonvenomous arthropods, initial encounter: Secondary | ICD-10-CM

## 2021-07-09 DIAGNOSIS — S50862A Insect bite (nonvenomous) of left forearm, initial encounter: Secondary | ICD-10-CM

## 2021-07-09 DIAGNOSIS — S50861A Insect bite (nonvenomous) of right forearm, initial encounter: Secondary | ICD-10-CM

## 2021-07-09 DIAGNOSIS — L309 Dermatitis, unspecified: Secondary | ICD-10-CM | POA: Diagnosis not present

## 2021-07-09 MED ORDER — DOXYCYCLINE 40 MG PO CPDR: DELAYED_RELEASE_CAPSULE | ORAL | 11 refills | Status: DC

## 2021-07-09 MED ORDER — EUCRISA 2 % EX OINT: 1.0000 "application " | TOPICAL_OINTMENT | CUTANEOUS | 3 refills | Status: DC

## 2021-07-09 NOTE — Patient Instructions (Addendum)
Recommend deep woods off when outside to protect from insect bites at arm.    Doxycycline should be taken with food to prevent nausea. Do not lay down for 30 minutes after taking. Be cautious with sun exposure and use good sun protection while on this medication. Pregnant women should not take this medication.   Topical steroids (such as triamcinolone, fluocinolone, fluocinonide, mometasone, clobetasol, halobetasol, betamethasone, hydrocortisone) can cause thinning and lightening of the skin if they are used for too long in the same area. Your physician has selected the right strength medicine for your problem and area affected on the body. Please use your medication only as directed by your physician to prevent side effects.   If you have any questions or concerns for your doctor, please call our main line at [phone] and press option 4 to reach your doctor's medical assistant. If no one answers, please leave a voicemail as directed and we will return your call as soon as possible. Messages left after 4 pm will be answered the following business day.   You may also send [place] a message via MyChart. We typically respond to MyChart messages within 1-2 business days.  For prescription refills, please ask your pharmacy to contact our office. Our fax number is [phone].  If you have an urgent issue when the clinic is closed that cannot wait until the next business day, you can page your doctor at the number below.    Please note that while we do our best to be available for urgent issues outside of office hours, we are not available 24/7.   If you have an urgent issue and are unable to reach [place], you may choose to seek medical care at your doctor's office, retail clinic, urgent care center, or emergency room.  If you have a medical emergency, please immediately call 911 or go to the emergency department.  Pager Numbers  - Dr. Gwen Pounds: [phone]  - Dr. Neale Burly: [phone]  - Dr. Roseanne Reno:  [phone]  In the event of inclement weather, please call our main line at [phone] for an update on the status of any delays or closures.  Dermatology Medication Tips: Please keep the boxes that topical medications come in in order to help keep track of the instructions about where and how to use these. Pharmacies typically print the medication instructions only on the boxes and not directly on the medication tubes.   If your medication is too expensive, please contact our office at [phone] option 4 or send [place] a message through MyChart.   We are unable to tell what your co-pay for medications will be in advance as this is different depending on your insurance coverage. However, we may be able to find a substitute medication at lower cost or fill out paperwork to get insurance to cover a needed medication.   If a prior authorization is required to get your medication covered by your insurance company, please allow [place] 1-2 business days to complete this process.  Drug prices often vary depending on where the prescription is filled and some pharmacies may offer cheaper prices.  The website www.goodrx.com contains coupons for medications through different pharmacies. The prices here do not account for what the cost may be with help from insurance (it may be cheaper with your insurance), but the website can give you the price if you did not use any insurance.  - You can print the associated coupon and take it with your prescription to the pharmacy.  -  You may also stop by our office during regular business hours and pick up a GoodRx coupon card.  - If you need your prescription sent electronically to a different pharmacy, notify our office through [facility] or by phone at [phone] option 4.

## 2021-07-09 NOTE — Progress Notes (Signed)
   Follow-Up Visit   Subjective  Deborah Collins is a 50 y.o. female who presents for the following: Follow-up (Patient here today for follow up on dermatitis at face and arms. She reports she is using eucrisa and mometasone as needed for flares of dermatitis. Patient is also here today for follow up on rosacea. She is using oracea 40 mg daily and topical ivermectin as needed. She also has concerns with bug bites at arms and would like to discuss how to treat them.).  Dermatitis and rosacea both well-controlled on medication.   The following portions of the chart were reviewed this encounter and updated as appropriate:      Review of Systems: No other skin or systemic complaints except as noted in HPI or Assessment and Plan.   Objective  Well appearing patient in no apparent distress; mood and affect are within normal limits.  A focused examination was performed including face and arms . Relevant physical exam findings are noted in the Assessment and Plan.  face and arms Rash clear today   Head - Anterior (Face) Mild erythema of nose and cheeks with telangectasia   bilateral arms Edematous light pink papules at arms   Assessment & Plan  Dermatitis face and arms  Chronic condition with duration or expected duration over one year. Currently well-controlled.  Continue mometasone cream as needed for flares body  Continue Eucrisa 2 % ointment as needed for flares at face and arms.   Recommend mild soap and moisturizing cream 1-2 times daily.  Gentle skin care handout provided.    Related Medications mometasone (ELOCON) 0.1 % cream Apply 1 application topically as directed. Qd to bid aa rash on forehead/temples up to 2 weeks prn flares  Crisaborole (EUCRISA) 2 % OINT Apply 1 application topically as directed. Qd to bid aa itchy rash on face and arms prn flares  Rosacea Head - Anterior (Face)  Improved on treatment  Rosacea is a chronic progressive skin condition usually  affecting the face of adults, causing redness and/or acne bumps. It is treatable but not curable. It sometimes affects the eyes (ocular rosacea) as well. It may respond to topical and/or systemic medication and can flare with stress, sun exposure, alcohol, exercise and some foods.  Daily application of broad spectrum spf 30+ sunscreen to face is recommended to reduce flares.  Continue Oracea 40 mg capsule   Continue ivermectin cream (Soolantra) qHS  doxycycline (ORACEA) 40 MG capsule - Head - Anterior (Face) TAKE ONE CAPSULE BY MOUTH EVERY MONRING WITH FOOD AND DRINK FOR ROSACEA  Insect bite of left forearm with local reaction, initial encounter bilateral arms  Allergic reaction to mosquito/arthropod bite (chiggers)  Recommend higher containing DEET insect repellant such as [place] Off when outdoors.  Also protective clothing.  Start mometasone cream qd/bid to bites prn itch  Return in about 1 year (around 07/09/2022) for eczema and rosacea follow up. I, Asher Muir, CMA, am acting as scribe for Willeen Niece, MD.  Documentation: I have reviewed the above documentation for accuracy and completeness, and I agree with the above.  Willeen Niece MD

## 2021-11-06 DIAGNOSIS — N319 Neuromuscular dysfunction of bladder, unspecified: Secondary | ICD-10-CM | POA: Diagnosis not present

## 2021-11-06 DIAGNOSIS — D72819 Decreased white blood cell count, unspecified: Secondary | ICD-10-CM | POA: Diagnosis not present

## 2021-11-06 DIAGNOSIS — D849 Immunodeficiency, unspecified: Secondary | ICD-10-CM | POA: Diagnosis not present

## 2021-11-06 DIAGNOSIS — F988 Other specified behavioral and emotional disorders with onset usually occurring in childhood and adolescence: Secondary | ICD-10-CM | POA: Diagnosis not present

## 2021-11-06 DIAGNOSIS — G35 Multiple sclerosis: Secondary | ICD-10-CM | POA: Diagnosis not present

## 2022-05-07 DIAGNOSIS — D72819 Decreased white blood cell count, unspecified: Secondary | ICD-10-CM | POA: Diagnosis not present

## 2022-05-07 DIAGNOSIS — N319 Neuromuscular dysfunction of bladder, unspecified: Secondary | ICD-10-CM | POA: Diagnosis not present

## 2022-05-07 DIAGNOSIS — E559 Vitamin D deficiency, unspecified: Secondary | ICD-10-CM | POA: Diagnosis not present

## 2022-05-07 DIAGNOSIS — F988 Other specified behavioral and emotional disorders with onset usually occurring in childhood and adolescence: Secondary | ICD-10-CM | POA: Diagnosis not present

## 2022-05-07 DIAGNOSIS — E538 Deficiency of other specified B group vitamins: Secondary | ICD-10-CM | POA: Diagnosis not present

## 2022-05-07 DIAGNOSIS — D849 Immunodeficiency, unspecified: Secondary | ICD-10-CM | POA: Diagnosis not present

## 2022-05-07 DIAGNOSIS — G35 Multiple sclerosis: Secondary | ICD-10-CM | POA: Diagnosis not present

## 2022-05-07 DIAGNOSIS — Z79899 Other long term (current) drug therapy: Secondary | ICD-10-CM | POA: Diagnosis not present

## 2022-06-10 DIAGNOSIS — K080 Exfoliation of teeth due to systemic causes: Secondary | ICD-10-CM | POA: Diagnosis not present

## 2022-07-01 ENCOUNTER — Other Ambulatory Visit (HOSPITAL_COMMUNITY)
Admission: RE | Admit: 2022-07-01 | Discharge: 2022-07-01 | Disposition: A | Discharge status: Home / Self Care | Payer: Federal, State, Local not specified - PPO | Source: Ambulatory Visit | Attending: Nurse Practitioner | Admitting: Nurse Practitioner

## 2022-07-01 ENCOUNTER — Other Ambulatory Visit: Payer: Self-pay | Admitting: Nurse Practitioner

## 2022-07-01 DIAGNOSIS — Z87410 Personal history of cervical dysplasia: Secondary | ICD-10-CM | POA: Diagnosis not present

## 2022-07-01 DIAGNOSIS — Z01419 Encounter for gynecological examination (general) (routine) without abnormal findings: Secondary | ICD-10-CM | POA: Diagnosis not present

## 2022-07-01 DIAGNOSIS — Z124 Encounter for screening for malignant neoplasm of cervix: Secondary | ICD-10-CM | POA: Insufficient documentation

## 2022-07-04 LAB — CYTOLOGY - PAP
Comment: NEGATIVE
Diagnosis: NEGATIVE
High risk HPV: NEGATIVE

## 2022-07-16 ENCOUNTER — Other Ambulatory Visit: Payer: Self-pay | Admitting: Nurse Practitioner

## 2022-07-16 DIAGNOSIS — Z1231 Encounter for screening mammogram for malignant neoplasm of breast: Secondary | ICD-10-CM

## 2022-07-16 DIAGNOSIS — R768 Other specified abnormal immunological findings in serum: Secondary | ICD-10-CM | POA: Diagnosis not present

## 2022-07-16 DIAGNOSIS — G35 Multiple sclerosis: Secondary | ICD-10-CM | POA: Diagnosis not present

## 2022-07-22 ENCOUNTER — Ambulatory Visit (INDEPENDENT_AMBULATORY_CARE_PROVIDER_SITE_OTHER): Payer: Federal, State, Local not specified - PPO | Admitting: Dermatology

## 2022-07-22 DIAGNOSIS — L918 Other hypertrophic disorders of the skin: Secondary | ICD-10-CM

## 2022-07-22 DIAGNOSIS — L719 Rosacea, unspecified: Secondary | ICD-10-CM | POA: Diagnosis not present

## 2022-07-22 DIAGNOSIS — L309 Dermatitis, unspecified: Secondary | ICD-10-CM | POA: Diagnosis not present

## 2022-07-22 MED ORDER — EUCRISA 2 % EX OINT: 1.0000 "application " | TOPICAL_OINTMENT | CUTANEOUS | 3 refills | Status: AC

## 2022-07-22 MED ORDER — DOXYCYCLINE 40 MG PO CPDR: DELAYED_RELEASE_CAPSULE | ORAL | 11 refills | Status: DC

## 2022-07-22 MED ORDER — IVERMECTIN 1 % EX CREA: TOPICAL_CREAM | CUTANEOUS | 5 refills | Status: DC

## 2022-07-22 MED ORDER — EPSOLAY 5 % EX CREA: TOPICAL_CREAM | CUTANEOUS | 5 refills | Status: DC

## 2022-07-22 MED ORDER — MOMETASONE FUROATE 0.1 % EX CREA: 1.0000 | TOPICAL_CREAM | CUTANEOUS | 2 refills | Status: DC

## 2022-07-22 NOTE — Patient Instructions (Addendum)
Cryotherapy Aftercare  Wash gently with soap and water everyday.   Apply Vaseline and Band-Aid daily until healed.     Due to recent changes in healthcare laws, you may see results of your pathology and/or laboratory studies on MyChart before the doctors have had a chance to review them. We understand that in some cases there may be results that are confusing or concerning to you. Please understand that not all results are received at the same time and often the doctors may need to interpret multiple results in order to provide you with the best plan of care or course of treatment. Therefore, we ask that you please give us 2 business days to thoroughly review all your results before contacting the office for clarification. Should we see a critical lab result, you will be contacted sooner.   If You Need Anything After Your Visit  If you have any questions or concerns for your doctor, please call our main line at [phone] and press option 4 to reach your doctor's medical assistant. If no one answers, please leave a voicemail as directed and we will return your call as soon as possible. Messages left after 4 pm will be answered the following business day.   You may also send us a message via MyChart. We typically respond to MyChart messages within 1-2 business days.  For prescription refills, please ask your pharmacy to contact our office. Our fax number is [phone].  If you have an urgent issue when the clinic is closed that cannot wait until the next business day, you can page your doctor at the number below.    Please note that while we do our best to be available for urgent issues outside of office hours, we are not available 24/7.   If you have an urgent issue and are unable to reach us, you may choose to seek medical care at your doctor's office, retail clinic, urgent care center, or emergency room.  If you have a medical emergency, please immediately call 911 or go to the  emergency department.  Pager Numbers  - Dr. Kowalski: [phone]  - Dr. Moye: [phone]  - Dr. Stewart: [phone]  In the event of inclement weather, please call our main line at [phone] for an update on the status of any delays or closures.  Dermatology Medication Tips: Please keep the boxes that topical medications come in in order to help keep track of the instructions about where and how to use these. Pharmacies typically print the medication instructions only on the boxes and not directly on the medication tubes.   If your medication is too expensive, please contact our office at [phone] option 4 or send us a message through MyChart.   We are unable to tell what your co-pay for medications will be in advance as this is different depending on your insurance coverage. However, we may be able to find a substitute medication at lower cost or fill out paperwork to get insurance to cover a needed medication.   If a prior authorization is required to get your medication covered by your insurance company, please allow us 1-2 business days to complete this process.  Drug prices often vary depending on where the prescription is filled and some pharmacies may offer cheaper prices.  The website www.goodrx.com contains coupons for medications through different pharmacies. The prices here do not account for what the cost may be with help from insurance (it may be cheaper with your insurance), but the website can   give you the price if you did not use any insurance.  - You can print the associated coupon and take it with your prescription to the pharmacy.  - You may also stop by our office during regular business hours and pick up a GoodRx coupon card.  - If you need your prescription sent electronically to a different pharmacy, notify our office through [facility] MyChart or by phone at [phone] option 4.     Si Usted Necesita Algo Despus de Su Visita  Tambin puede  enviarnos un mensaje a travs de MyChart. Por lo general respondemos a los mensajes de MyChart en el transcurso de 1 a 2 das hbiles.  Para renovar recetas, por favor pida a su farmacia que se ponga en contacto con nuestra oficina. Nuestro nmero de fax es el [phone].  Si tiene un asunto urgente cuando la clnica est cerrada y que no puede esperar hasta el siguiente da hbil, puede llamar/localizar a su doctor(a) al nmero que aparece a continuacin.   Por favor, tenga en cuenta que aunque hacemos todo lo posible para estar disponibles para asuntos urgentes fuera del horario de oficina, no estamos disponibles las 24 horas del da, los 7 das de la semana.   Si tiene un problema urgente y no puede comunicarse con nosotros, puede optar por buscar atencin mdica  en el consultorio de su doctor(a), en una clnica privada, en un centro de atencin urgente o en una sala de emergencias.  Si tiene una emergencia mdica, por favor llame inmediatamente al 911 o vaya a la sala de emergencias.  Nmeros de bper  - Dr. Kowalski: [phone]  - Dra. Moye: [phone]  - Dra. Stewart: [phone]  En caso de inclemencias del tiempo, por favor llame a nuestra lnea principal al [phone] para una actualizacin sobre el estado de cualquier retraso o cierre.  Consejos para la medicacin en dermatologa: Por favor, guarde las cajas en las que vienen los medicamentos de uso tpico para ayudarle a seguir las instrucciones sobre dnde y cmo usarlos. Las farmacias generalmente imprimen las instrucciones del medicamento slo en las cajas y no directamente en los tubos del medicamento.   Si su medicamento es muy caro, por favor, pngase en contacto con nuestra oficina llamando al [phone] y presione la opcin 4 o envenos un mensaje a travs de MyChart.   No podemos decirle cul ser su copago por los medicamentos por adelantado ya que esto es diferente dependiendo de la cobertura de su seguro.  Sin embargo, es posible que podamos encontrar un medicamento sustituto a menor costo o llenar un formulario para que el seguro cubra el medicamento que se considera necesario.   Si se requiere una autorizacin previa para que su compaa de seguros cubra su medicamento, por favor permtanos de 1 a 2 das hbiles para completar este proceso.  Los precios de los medicamentos varan con frecuencia dependiendo del lugar de dnde se surte la receta y alguna farmacias pueden ofrecer precios ms baratos.  El sitio web www.goodrx.com tiene cupones para medicamentos de diferentes farmacias. Los precios aqu no tienen en cuenta lo que podra costar con la ayuda del seguro (puede ser ms barato con su seguro), pero el sitio web puede darle el precio si no utiliz ningn seguro.  - Puede imprimir el cupn correspondiente y llevarlo con su receta a la farmacia.  - Tambin puede pasar por nuestra oficina durante el horario de atencin regular y recoger una tarjeta de cupones de GoodRx.  -   Si necesita que su receta se enve electrnicamente a una farmacia diferente, informe a nuestra oficina a travs de MyChart de [place] o por telfono llamando al [phone] y presione la opcin 4.  

## 2022-07-22 NOTE — Progress Notes (Signed)
Follow-Up Visit   Subjective  Deborah Collins is a 51 y.o. female who presents for the following: Follow-up (1 year follow-up).  Patient presents for yearly check for Rosacea. She is taking Oracea 40mg  daily and using Soolantra Cream once daily.  She has improved some but she continues to get breakouts on her nose at least once a month.  She also has a history of eczema, but hasn't needed to use topical creams in a while. She thinks she had a reaction to B12 injections, which she is not on now.  He has not had a problem with the rash since she stopped the injections.  She has a tag of the left axilla that is irritated and she would like removed.   The following portions of the chart were reviewed this encounter and updated as appropriate:       Review of Systems:  No other skin or systemic complaints except as noted in HPI or Assessment and Plan.  Objective  Well appearing patient in no apparent distress; mood and affect are within normal limits.  A focused examination was performed including face, axilla. Relevant physical exam findings are noted in the Assessment and Plan.  face Mid face erythema with telangiectasias +/- scattered inflammatory papules.   face, trunk, extremities Clear today.   Left Axilla Pedunculated flesh papule.    Assessment & Plan  Rosacea face  Chronic and persistent condition with duration or expected duration over one year. Condition is symptomatic/ bothersome to patient. Improving but not currently at goal. Still getting bumps on nose.  Rosacea is a chronic progressive skin condition usually affecting the face of adults, causing redness and/or acne bumps. It is treatable but not curable. It sometimes affects the eyes (ocular rosacea) as well. It may respond to topical and/or systemic medication and can flare with stress, sun exposure, alcohol, exercise, topical steroids (including hydrocortisone/cortisone 10) and some foods.  Daily application  of broad spectrum spf 30+ sunscreen to face is recommended to reduce flares.  Continue Oracea 40mg  take 1 po QD with food dsp #30 73yr Rf. Doxycycline should be taken with food to prevent nausea. Do not lay down for 30 minutes after taking. Be cautious with sun exposure and use good sun protection while on this medication. Pregnant women should not take this medication.   Continue Soolantra cream Apply to aas face qhs  Start Epsolay Cream Apply to aas face QD Benzoyl peroxide can cause dryness and irritation of the skin. It can also bleach fabric. When used together with Aczone (dapsone) cream, it can stain the skin orange.   benzoyl peroxide (EPSOLAY) 5 % cream - face Apply to face every morning for rosacea.  Related Medications doxycycline (ORACEA) 40 MG capsule TAKE ONE CAPSULE BY MOUTH EVERY MONRING WITH FOOD AND DRINK FOR ROSACEA  Dermatitis face, trunk, extremities  Resolved. Possibly related to B12 injections patient was previously on which she has stopped getting.   Recommend mild soap and moisturizing cream 1-2 times daily.  Gentle skin care handout provided.   Continue Eucrisa ointment qd/bid prn flares. Continue mometasone cream qd/bid prn flares. Topical steroids (such as triamcinolone, fluocinolone, fluocinonide, mometasone, clobetasol, halobetasol, betamethasone, hydrocortisone) can cause thinning and lightening of the skin if they are used for too long in the same area. Your physician has selected the right strength medicine for your problem and area affected on the body. Please use your medication only as directed by your physician to prevent side effects.  Related Medications mometasone (ELOCON) 0.1 % cream Apply 1 Application topically as directed. Qd to bid aa rash on forehead/temples up to 2 weeks prn flares  Crisaborole (EUCRISA) 2 % OINT Apply 1 application  topically as directed. Qd to bid aa itchy rash on face and arms prn flares  Skin tag Left  Axilla  vs Inflamed SK  Discussed cosmetic procedure (cryotherapy), noncovered.  $60 for 1st lesion and $15 for each additional lesion if done on the same day.  Maximum charge $350.  One touch-up treatment included no charge. Discussed risks of treatment including dyspigmentation, small scar, and/or recurrence. Recommend daily broad spectrum sunscreen SPF 30+/photoprotection to treated areas once healed.  One skin tag treated today, $60 charge.   Destruction of lesion - Left Axilla  Destruction method: cryotherapy   Informed consent: discussed and consent obtained   Lesion destroyed using liquid nitrogen: Yes   Region frozen until ice ball extended beyond lesion: Yes   Outcome: patient tolerated procedure well with no complications   Post-procedure details: wound care instructions given   Additional details:  Prior to procedure, discussed risks of blister formation, small wound, skin dyspigmentation, or rare scar following cryotherapy. Recommend Vaseline ointment to treated areas while healing.    Return in about 1 year (around 07/23/2023) for TBSE.  IJamesetta Orleans, CMA, am acting as scribe for Brendolyn Patty, MD .  Documentation: I have reviewed the above documentation for accuracy and completeness, and I agree with the above.  Brendolyn Patty MD

## 2022-08-12 ENCOUNTER — Ambulatory Visit
Admission: RE | Admit: 2022-08-12 | Discharge: 2022-08-12 | Disposition: A | Discharge status: Home / Self Care | Payer: Federal, State, Local not specified - PPO | Source: Ambulatory Visit | Attending: Nurse Practitioner | Admitting: Nurse Practitioner

## 2022-08-12 DIAGNOSIS — Z1231 Encounter for screening mammogram for malignant neoplasm of breast: Secondary | ICD-10-CM | POA: Diagnosis not present

## 2022-09-02 DIAGNOSIS — R768 Other specified abnormal immunological findings in serum: Secondary | ICD-10-CM | POA: Diagnosis not present

## 2022-11-12 DIAGNOSIS — R899 Unspecified abnormal finding in specimens from other organs, systems and tissues: Secondary | ICD-10-CM | POA: Diagnosis not present

## 2022-11-12 DIAGNOSIS — D72819 Decreased white blood cell count, unspecified: Secondary | ICD-10-CM | POA: Diagnosis not present

## 2022-11-12 DIAGNOSIS — E559 Vitamin D deficiency, unspecified: Secondary | ICD-10-CM | POA: Diagnosis not present

## 2022-11-12 DIAGNOSIS — G35 Multiple sclerosis: Secondary | ICD-10-CM | POA: Diagnosis not present

## 2022-11-12 DIAGNOSIS — D849 Immunodeficiency, unspecified: Secondary | ICD-10-CM | POA: Diagnosis not present

## 2023-02-10 DIAGNOSIS — G35 Multiple sclerosis: Secondary | ICD-10-CM | POA: Diagnosis not present

## 2023-02-10 DIAGNOSIS — N319 Neuromuscular dysfunction of bladder, unspecified: Secondary | ICD-10-CM | POA: Diagnosis not present

## 2023-02-10 DIAGNOSIS — D72819 Decreased white blood cell count, unspecified: Secondary | ICD-10-CM | POA: Diagnosis not present

## 2023-02-10 DIAGNOSIS — E559 Vitamin D deficiency, unspecified: Secondary | ICD-10-CM | POA: Diagnosis not present

## 2023-05-13 DIAGNOSIS — N319 Neuromuscular dysfunction of bladder, unspecified: Secondary | ICD-10-CM | POA: Diagnosis not present

## 2023-05-13 DIAGNOSIS — D849 Immunodeficiency, unspecified: Secondary | ICD-10-CM | POA: Diagnosis not present

## 2023-05-13 DIAGNOSIS — Z79899 Other long term (current) drug therapy: Secondary | ICD-10-CM | POA: Diagnosis not present

## 2023-05-13 DIAGNOSIS — D72819 Decreased white blood cell count, unspecified: Secondary | ICD-10-CM | POA: Diagnosis not present

## 2023-05-13 DIAGNOSIS — E559 Vitamin D deficiency, unspecified: Secondary | ICD-10-CM | POA: Diagnosis not present

## 2023-05-13 DIAGNOSIS — G35 Multiple sclerosis: Secondary | ICD-10-CM | POA: Diagnosis not present

## 2023-05-25 ENCOUNTER — Other Ambulatory Visit: Payer: Self-pay | Admitting: Nurse Practitioner

## 2023-05-25 DIAGNOSIS — G35 Multiple sclerosis: Secondary | ICD-10-CM

## 2023-07-04 ENCOUNTER — Ambulatory Visit
Admission: RE | Admit: 2023-07-04 | Discharge: 2023-07-04 | Disposition: A | Discharge status: Home / Self Care | Payer: Federal, State, Local not specified - PPO | Source: Ambulatory Visit | Attending: Nurse Practitioner | Admitting: Nurse Practitioner

## 2023-07-04 DIAGNOSIS — G35 Multiple sclerosis: Secondary | ICD-10-CM

## 2023-07-04 MED ORDER — GADOPICLENOL 0.5 MMOL/ML IV SOLN
6.0000 mL | Freq: Once | INTRAVENOUS | Status: AC | PRN
  Administered 2023-07-04: 6 mL via INTRAVENOUS

## 2023-07-08 ENCOUNTER — Ambulatory Visit (INDEPENDENT_AMBULATORY_CARE_PROVIDER_SITE_OTHER): Payer: Federal, State, Local not specified - PPO | Admitting: Dermatology

## 2023-07-08 VITALS — BP 100/68 | HR 83

## 2023-07-08 DIAGNOSIS — S80861A Insect bite (nonvenomous), right lower leg, initial encounter: Secondary | ICD-10-CM | POA: Diagnosis not present

## 2023-07-08 DIAGNOSIS — L814 Other melanin hyperpigmentation: Secondary | ICD-10-CM

## 2023-07-08 DIAGNOSIS — W57XXXA Bitten or stung by nonvenomous insect and other nonvenomous arthropods, initial encounter: Secondary | ICD-10-CM | POA: Diagnosis not present

## 2023-07-08 DIAGNOSIS — Z1283 Encounter for screening for malignant neoplasm of skin: Secondary | ICD-10-CM | POA: Diagnosis not present

## 2023-07-08 DIAGNOSIS — D1801 Hemangioma of skin and subcutaneous tissue: Secondary | ICD-10-CM

## 2023-07-08 DIAGNOSIS — L578 Other skin changes due to chronic exposure to nonionizing radiation: Secondary | ICD-10-CM

## 2023-07-08 DIAGNOSIS — D225 Melanocytic nevi of trunk: Secondary | ICD-10-CM

## 2023-07-08 DIAGNOSIS — W908XXA Exposure to other nonionizing radiation, initial encounter: Secondary | ICD-10-CM

## 2023-07-08 DIAGNOSIS — D229 Melanocytic nevi, unspecified: Secondary | ICD-10-CM

## 2023-07-08 DIAGNOSIS — S80862A Insect bite (nonvenomous), left lower leg, initial encounter: Secondary | ICD-10-CM

## 2023-07-08 DIAGNOSIS — L821 Other seborrheic keratosis: Secondary | ICD-10-CM

## 2023-07-08 DIAGNOSIS — L719 Rosacea, unspecified: Secondary | ICD-10-CM

## 2023-07-08 MED ORDER — EPSOLAY 5 % EX CREA: TOPICAL_CREAM | CUTANEOUS | 5 refills | Status: DC

## 2023-07-08 MED ORDER — DOXYCYCLINE 40 MG PO CPDR: DELAYED_RELEASE_CAPSULE | ORAL | 11 refills | Status: DC

## 2023-07-08 MED ORDER — CLOBETASOL PROPIONATE 0.05 % EX CREA: TOPICAL_CREAM | CUTANEOUS | 0 refills | Status: AC

## 2023-07-08 NOTE — Patient Instructions (Addendum)
Topical steroids (such as triamcinolone, fluocinolone, fluocinonide, mometasone, clobetasol, halobetasol, betamethasone, hydrocortisone) can cause thinning and lightening of the skin if they are used for too long in the same area. Your physician has selected the right strength medicine for your problem and area affected on the body. Please use your medication only as directed by your physician to prevent side effects.       Due to recent changes in healthcare laws, you may see results of your pathology and/or laboratory studies on MyChart before the doctors have had a chance to review them. We understand that in some cases there may be results that are confusing or concerning to you. Please understand that not all results are received at the same time and often the doctors may need to interpret multiple results in order to provide you with the best plan of care or course of treatment. Therefore, we ask that you please give [place] 2 business days to thoroughly review all your results before contacting the office for clarification. Should we see a critical lab result, you will be contacted sooner.   If You Need Anything After Your Visit  If you have any questions or concerns for your doctor, please call our main line at [phone] and press option 4 to reach your doctor's medical assistant. If no one answers, please leave a voicemail as directed and we will return your call as soon as possible. Messages left after 4 pm will be answered the following business day.   You may also send [place] a message via MyChart. We typically respond to MyChart messages within 1-2 business days.  For prescription refills, please ask your pharmacy to contact our office. Our fax number is [phone].  If you have an urgent issue when the clinic is closed that cannot wait until the next business day, you can page your doctor at the number below.    Please note that while we do our best to be available for urgent issues  outside of office hours, we are not available 24/7.   If you have an urgent issue and are unable to reach [place], you may choose to seek medical care at your doctor's office, retail clinic, urgent care center, or emergency room.  If you have a medical emergency, please immediately call 911 or go to the emergency department.  Pager Numbers  - Dr. Gwen Pounds: [phone]  - Dr. Roseanne Reno: [phone]  - Dr. Katrinka Blazing: [phone]   In the event of inclement weather, please call our main line at [phone] for an update on the status of any delays or closures.  Dermatology Medication Tips: Please keep the boxes that topical medications come in in order to help keep track of the instructions about where and how to use these. Pharmacies typically print the medication instructions only on the boxes and not directly on the medication tubes.   If your medication is too expensive, please contact our office at [phone] option 4 or send [place] a message through MyChart.   We are unable to tell what your co-pay for medications will be in advance as this is different depending on your insurance coverage. However, we may be able to find a substitute medication at lower cost or fill out paperwork to get insurance to cover a needed medication.   If a prior authorization is required to get your medication covered by your insurance company, please allow [place] 1-2 business days to complete this process.  Drug prices often vary depending on where the prescription is  filled and some pharmacies may offer cheaper prices.  The website www.goodrx.com contains coupons for medications through different pharmacies. The prices here do not account for what the cost may be with help from insurance (it may be cheaper with your insurance), but the website can give you the price if you did not use any insurance.  - You can print the associated coupon and take it with your prescription to the pharmacy.  - You may also stop by our  office during regular business hours and pick up a GoodRx coupon card.  - If you need your prescription sent electronically to a different pharmacy, notify our office through [facility] or by phone at [phone] option 4.     Si Usted Necesita Algo Despus de Su Visita  Tambin puede enviarnos un mensaje a travs de Clinical cytogeneticist. Por lo general respondemos a los mensajes de MyChart en el transcurso de 1 a 2 das hbiles.  Para renovar recetas, por favor pida a su farmacia que se ponga en contacto con nuestra oficina. Annie Sable de fax es [place] [phone].  Si tiene un asunto urgente cuando la clnica est cerrada y que no puede esperar hasta el siguiente da hbil, puede llamar/localizar a su doctor(a) al nmero que aparece a continuacin.   Por favor, tenga en cuenta que aunque hacemos todo lo posible para estar disponibles para asuntos urgentes fuera del horario de [place], no estamos disponibles las 24 horas del da, los 7 [address]  [address] de la [place].   Si tiene un problema urgente y no puede comunicarse con nosotros, puede optar por buscar atencin mdica  en el consultorio de su doctor(a), en una clnica privada, en un centro de atencin urgente o en una sala de emergencias.  Si tiene Engineer, drilling, por favor llame inmediatamente al 911 o vaya a la sala de emergencias.  Nmeros de bper  - Dr. Gwen Pounds: [phone]  - Dra. Roseanne Reno: [phone]  - Dr. Katrinka Blazing: [phone]   En caso de inclemencias del tiempo, por favor llame a Lacy Duverney principal al [phone] para una actualizacin sobre el [place] de cualquier retraso o cierre.  Consejos para la medicacin en dermatologa: Por favor, guarde las cajas en las que vienen los medicamentos de uso tpico para ayudarle a seguir las instrucciones sobre dnde y cmo usarlos. Las farmacias generalmente imprimen las instrucciones del medicamento slo en las cajas y no directamente en los tubos del [place].   Si su  medicamento es muy caro, por favor, pngase en contacto con Rolm Gala llamando al [phone] y presione la opcin 4 o envenos un mensaje a travs de Clinical cytogeneticist.   No podemos decirle cul ser su copago por los medicamentos por adelantado ya que esto es diferente dependiendo de la cobertura de su seguro. Sin embargo, es posible que podamos encontrar un medicamento sustituto a Audiological scientist un formulario para que el seguro cubra el medicamento que se considera necesario.   Si se requiere una autorizacin previa para que su compaa de seguros [place] su medicamento, por favor permtanos de 1 a 2 das hbiles para completar [address]  Los precios de los medicamentos varan con frecuencia dependiendo del Environmental consultant de dnde se surte la receta y alguna farmacias pueden ofrecer precios ms baratos.  El sitio web www.goodrx.com tiene cupones para medicamentos de Health and safety inspector. Los precios aqu no tienen en cuenta lo que podra costar con la ayuda del seguro (puede ser ms barato con su seguro), pero el sitio web puede  darle el precio si no utiliz [employer].  - Puede imprimir el cupn correspondiente y llevarlo con su receta a la farmacia.  - Tambin puede pasar por nuestra oficina durante el horario de atencin regular y Education officer, museum una tarjeta de cupones de GoodRx.  - Si necesita que su receta se enve electrnicamente a una farmacia diferente, informe a nuestra oficina a travs de MyChart de [place] o por telfono llamando al [phone] y presione la opcin 4.

## 2023-07-08 NOTE — Progress Notes (Signed)
Follow-Up Visit   Subjective  Deborah Collins is a 52 y.o. female who presents for the following: Skin Cancer Screening and Full Body Skin Exam  The patient presents for Total-Body Skin Exam (TBSE) for skin cancer screening and mole check. The patient has spots, moles and lesions to be evaluated, some may be new or changing.  She has bites on her lower legs/ankles possibly from chiggers, itchy. She has rosacea of the face, controlled with Oracea 40 MG and Epsolay 5% cream. Patient has a history of dermatitis, but resolved after stopping B12 injections.    The following portions of the chart were reviewed this encounter and updated as appropriate: medications, allergies, medical history  Review of Systems:  No other skin or systemic complaints except as noted in HPI or Assessment and Plan.  Objective  Well appearing patient in no apparent distress; mood and affect are within normal limits.  A full examination was performed including scalp, head, eyes, ears, nose, lips, neck, chest, axillae, abdomen, back, buttocks, bilateral upper extremities, bilateral lower extremities, hands, feet, fingers, toes, fingernails, and toenails. All findings within normal limits unless otherwise noted below.   Relevant physical exam findings are noted in the Assessment and Plan.  lower legs Numerous pink vesicluar papules on the legs and lower abdomen    Assessment & Plan   SKIN CANCER SCREENING PERFORMED TODAY.  ACTINIC DAMAGE - Chronic condition, secondary to cumulative UV/sun exposure - diffuse scaly erythematous macules with underlying dyspigmentation - Recommend daily broad spectrum sunscreen SPF 30+ to sun-exposed areas, reapply every 2 hours as needed.  - Staying in the shade or wearing long sleeves, sun glasses (UVA+UVB protection) and wide brim hats (4-inch brim around the entire circumference of the hat) are also recommended for sun protection.  - Call for new or changing  lesions.  LENTIGINES, SEBORRHEIC KERATOSES, HEMANGIOMAS - Benign normal skin lesions - Benign-appearing - Call for any changes  MELANOCYTIC NEVI - Tan-brown and/or pink-flesh-colored symmetric macules and papules - 5 mm brown macule right spinal mid upper back - Benign appearing on exam today - Observation - Call clinic for new or changing moles - Recommend daily use of broad spectrum spf 30+ sunscreen to sun-exposed areas.  ROSACEA Exam Mild erythema with telangiectasias nose and malar cheeks.  Chronic and persistent condition with duration or expected duration over one year. Condition is improving with treatment but not currently at goal.   Rosacea is a chronic progressive skin condition usually affecting the face of adults, causing redness and/or acne bumps. It is treatable but not curable. It sometimes affects the eyes (ocular rosacea) as well. It may respond to topical and/or systemic medication and can flare with stress, sun exposure, alcohol, exercise, topical steroids (including hydrocortisone/cortisone 10) and some foods.  Daily application of broad spectrum spf 30+ sunscreen to face is recommended to reduce flares.  Patient denies grittiness of the eyes  Treatment Plan Continue doxycycline 40 MG take 1 po every day with food dsp #30 1 yr Rf.  Doxycycline should be taken with food to prevent nausea. Do not lay down for 30 minutes after taking. Be cautious with sun exposure and use good sun protection while on this medication. Pregnant women should not take this medication.   Continue Epsolay Cream every day prn flares  Bug bite without infection, initial encounter lower legs  Likely chiggers  Start clobetasol cream Apply to aa bites twice daily until improved dsp 60g 0Rf. Avoid applying to face, groin, and axilla.  Use as directed. Long-term use can cause thinning of the skin. Topical steroids (such as triamcinolone, fluocinolone, fluocinonide, mometasone, clobetasol,  halobetasol, betamethasone, hydrocortisone) can cause thinning and lightening of the skin if they are used for too long in the same area. Your physician has selected the right strength medicine for your problem and area affected on the body. Please use your medication only as directed by your physician to prevent side effects.   Start Benadryl oral as needed at bedtime.   Recommend higher containing DEET insect repellant such as [place] [employer] when outdoors. Also wear protective clothing.   Rosacea  Related Medications doxycycline (ORACEA) 40 MG capsule TAKE ONE CAPSULE BY MOUTH EVERY MONRING WITH FOOD AND DRINK FOR ROSACEA  benzoyl peroxide (EPSOLAY) 5 % cream Apply to face every morning for rosacea.   Return in about 1 year (around 07/07/2024) for TBSE, Rosacea.  ICherlyn Labella, CMA, am acting as scribe for Willeen Niece, MD .   Documentation: I have reviewed the above documentation for accuracy and completeness, and I agree with the above.  Willeen Niece, MD

## 2023-07-20 ENCOUNTER — Other Ambulatory Visit (HOSPITAL_COMMUNITY)
Admission: RE | Admit: 2023-07-20 | Discharge: 2023-07-20 | Disposition: A | Discharge status: Home / Self Care | Payer: Federal, State, Local not specified - PPO | Source: Ambulatory Visit | Attending: Nurse Practitioner | Admitting: Nurse Practitioner

## 2023-07-20 ENCOUNTER — Other Ambulatory Visit: Payer: Self-pay | Admitting: Nurse Practitioner

## 2023-07-20 DIAGNOSIS — Z1151 Encounter for screening for human papillomavirus (HPV): Secondary | ICD-10-CM | POA: Diagnosis not present

## 2023-07-20 DIAGNOSIS — R87612 Low grade squamous intraepithelial lesion on cytologic smear of cervix (LGSIL): Secondary | ICD-10-CM | POA: Insufficient documentation

## 2023-07-20 DIAGNOSIS — Z124 Encounter for screening for malignant neoplasm of cervix: Secondary | ICD-10-CM | POA: Insufficient documentation

## 2023-07-20 DIAGNOSIS — Z01411 Encounter for gynecological examination (general) (routine) with abnormal findings: Secondary | ICD-10-CM | POA: Diagnosis not present

## 2023-07-27 ENCOUNTER — Ambulatory Visit
Admission: RE | Admit: 2023-07-27 | Discharge: 2023-07-27 | Disposition: A | Discharge status: Home / Self Care | Payer: Federal, State, Local not specified - PPO | Source: Ambulatory Visit | Attending: Nurse Practitioner | Admitting: Nurse Practitioner

## 2023-07-27 ENCOUNTER — Other Ambulatory Visit: Payer: Self-pay | Admitting: Nurse Practitioner

## 2023-07-27 DIAGNOSIS — G35 Multiple sclerosis: Secondary | ICD-10-CM

## 2023-07-27 LAB — CYTOLOGY - PAP
Comment: NEGATIVE
High risk HPV: NEGATIVE

## 2023-07-27 MED ORDER — GADOPICLENOL 0.5 MMOL/ML IV SOLN
7.0000 mL | Freq: Once | INTRAVENOUS | Status: AC | PRN
  Administered 2023-07-27: 7 mL via INTRAVENOUS

## 2023-07-28 ENCOUNTER — Encounter: Payer: Federal, State, Local not specified - PPO | Admitting: Dermatology

## 2023-10-02 ENCOUNTER — Other Ambulatory Visit: Payer: Self-pay | Admitting: Nurse Practitioner

## 2023-10-02 DIAGNOSIS — M899 Disorder of bone, unspecified: Secondary | ICD-10-CM

## 2023-11-14 ENCOUNTER — Ambulatory Visit
Admission: RE | Admit: 2023-11-14 | Discharge: 2023-11-14 | Disposition: A | Discharge status: Home / Self Care | Payer: Federal, State, Local not specified - PPO | Source: Ambulatory Visit | Attending: Nurse Practitioner | Admitting: Nurse Practitioner

## 2023-11-14 DIAGNOSIS — M899 Disorder of bone, unspecified: Secondary | ICD-10-CM

## 2023-11-14 MED ORDER — GADOPICLENOL 0.5 MMOL/ML IV SOLN
6.0000 mL | Freq: Once | INTRAVENOUS | Status: AC | PRN
  Administered 2023-11-14: 6 mL via INTRAVENOUS

## 2024-07-03 ENCOUNTER — Other Ambulatory Visit: Payer: Self-pay | Admitting: Dermatology

## 2024-07-03 DIAGNOSIS — L719 Rosacea, unspecified: Secondary | ICD-10-CM

## 2024-07-19 ENCOUNTER — Ambulatory Visit (INDEPENDENT_AMBULATORY_CARE_PROVIDER_SITE_OTHER): Payer: Federal, State, Local not specified - PPO | Admitting: Dermatology

## 2024-07-19 DIAGNOSIS — W908XXA Exposure to other nonionizing radiation, initial encounter: Secondary | ICD-10-CM

## 2024-07-19 DIAGNOSIS — D224 Melanocytic nevi of scalp and neck: Secondary | ICD-10-CM

## 2024-07-19 DIAGNOSIS — L578 Other skin changes due to chronic exposure to nonionizing radiation: Secondary | ICD-10-CM | POA: Diagnosis not present

## 2024-07-19 DIAGNOSIS — Z1283 Encounter for screening for malignant neoplasm of skin: Secondary | ICD-10-CM

## 2024-07-19 DIAGNOSIS — D1801 Hemangioma of skin and subcutaneous tissue: Secondary | ICD-10-CM

## 2024-07-19 DIAGNOSIS — D225 Melanocytic nevi of trunk: Secondary | ICD-10-CM

## 2024-07-19 DIAGNOSIS — L821 Other seborrheic keratosis: Secondary | ICD-10-CM

## 2024-07-19 DIAGNOSIS — D2221 Melanocytic nevi of right ear and external auricular canal: Secondary | ICD-10-CM

## 2024-07-19 DIAGNOSIS — L719 Rosacea, unspecified: Secondary | ICD-10-CM

## 2024-07-19 DIAGNOSIS — W57XXXA Bitten or stung by nonvenomous insect and other nonvenomous arthropods, initial encounter: Secondary | ICD-10-CM

## 2024-07-19 DIAGNOSIS — L814 Other melanin hyperpigmentation: Secondary | ICD-10-CM | POA: Diagnosis not present

## 2024-07-19 DIAGNOSIS — D229 Melanocytic nevi, unspecified: Secondary | ICD-10-CM

## 2024-07-19 MED ORDER — DOXYCYCLINE 40 MG PO CPDR: DELAYED_RELEASE_CAPSULE | ORAL | 11 refills | Status: AC

## 2024-07-19 MED ORDER — EPSOLAY 5 % EX CREA: TOPICAL_CREAM | CUTANEOUS | 5 refills | Status: AC

## 2024-07-19 MED ORDER — SOOLANTRA 1 % EX CREA: TOPICAL_CREAM | CUTANEOUS | 11 refills | Status: AC

## 2024-07-19 MED ORDER — MOMETASONE FUROATE 0.1 % EX CREA: TOPICAL_CREAM | CUTANEOUS | 0 refills | Status: AC

## 2024-07-19 NOTE — Patient Instructions (Signed)

## 2024-07-19 NOTE — Progress Notes (Signed)
 Follow-Up Visit   Subjective  Deborah Collins is a 53 y.o. female who presents for the following: Skin Cancer Screening and Full Body Skin Exam  The patient presents for Total-Body Skin Exam (TBSE) for skin cancer screening and mole check. The patient has spots, moles and lesions to be evaluated, some may be new or changing and the patient may have concern these could be cancer.  Pt with hx of dermatitis which resolved after stopping B12 injections.  The following portions of the chart were reviewed this encounter and updated as appropriate: medications, allergies, medical history  Review of Systems:  No other skin or systemic complaints except as noted in HPI or Assessment and Plan.  Objective  Well appearing patient in no apparent distress; mood and affect are within normal limits.  A full examination was performed including scalp, head, eyes, ears, nose, lips, neck, chest, axillae, abdomen, back, buttocks, bilateral upper extremities, bilateral lower extremities, hands, feet, fingers, toes, fingernails, and toenails. All findings within normal limits unless otherwise noted below.   Relevant physical exam findings are noted in the Assessment and Plan.   Assessment & Plan   SKIN CANCER SCREENING PERFORMED TODAY.  ACTINIC DAMAGE - Chronic condition, secondary to cumulative UV/sun exposure - diffuse scaly erythematous macules with underlying dyspigmentation - Recommend daily broad spectrum sunscreen SPF 30+ to sun-exposed areas, reapply every 2 hours as needed.  - Staying in the shade or wearing long sleeves, sun glasses (UVA+UVB protection) and wide brim hats (4-inch brim around the entire circumference of the hat) are also recommended for sun protection.  - Call for new or changing lesions.  LENTIGINES, SEBORRHEIC KERATOSES, HEMANGIOMAS - Benign normal skin lesions - Benign-appearing - Call for any changes  MELANOCYTIC NEVI - 0.4 cm tan macule at R ear helix - 5 mm brown  macule right spinal mid upper back - 9 mm pink tan papule L parietal scalp - Tan-brown and/or pink-flesh-colored symmetric macules and papules - Benign appearing on exam today - Observation - Call clinic for new or changing moles - Recommend daily use of broad spectrum spf 30+ sunscreen to sun-exposed areas.   ROSACEA Exam Mild erythema with telangiectasias at the nose, pink papules at L eyebrow and L cheek.   Chronic and persistent condition with duration or expected duration over one year. Condition is improving with treatment but not currently at goal.   Rosacea is a chronic progressive skin condition usually affecting the face of adults, causing redness and/or acne bumps. It is treatable but not curable. It sometimes affects the eyes (ocular rosacea) as well. It may respond to topical and/or systemic medication and can flare with stress, sun exposure, alcohol, exercise, topical steroids (including hydrocortisone/cortisone 10) and some foods.  Daily application of broad spectrum spf 30+ sunscreen to face is recommended to reduce flares.   Treatment Plan: Continue doxycycline  40 MG take 1 po every day with food dsp #30 1 yr Rf.  Doxycycline  should be taken with food to prevent nausea. Do not lay down for 30 minutes after taking. Be cautious with sun exposure and use good sun protection while on this medication. Pregnant women should not take this medication.    Continue Ivermectin  cream QD PRN flares.  Continue Epsolay  cream QD PRN flares.  INSECT BITE REACTION Exam: edematous pink papule(s) legs  Treatment Plan: Benign, observe.   Continue Mometasone  0.1% cream to aa's BID PRN.  Topical steroids (such as triamcinolone, fluocinolone, fluocinonide, mometasone , clobetasol , halobetasol, betamethasone, hydrocortisone) can cause  thinning and lightening of the skin if they are used for too long in the same area. Your physician has selected the right strength medicine for your problem and  area affected on the body. Please use your medication only as directed by your physician to prevent side effects.   Recommend DEET containing insect repellant (25% DEET or higher) such as [place] Off or [employer] and wear protective clothing when outdoors.  Return in about 1 year (around 07/19/2025) for TBSE.  LILLETTE Rosina Mayans, CMA, am acting as scribe for Rexene Rattler, MD .   Documentation: I have reviewed the above documentation for accuracy and completeness, and I agree with the above.  Rexene Rattler, MD

## 2024-09-26 ENCOUNTER — Other Ambulatory Visit: Payer: Self-pay | Admitting: Nurse Practitioner

## 2024-09-26 DIAGNOSIS — N950 Postmenopausal bleeding: Secondary | ICD-10-CM

## 2024-10-17 ENCOUNTER — Ambulatory Visit
Admission: RE | Admit: 2024-10-17 | Discharge: 2024-10-17 | Disposition: A | Discharge status: Home / Self Care | Source: Ambulatory Visit | Attending: Nurse Practitioner | Admitting: Nurse Practitioner

## 2024-10-17 DIAGNOSIS — N950 Postmenopausal bleeding: Secondary | ICD-10-CM

## 2025-07-25 ENCOUNTER — Encounter: Admitting: Dermatology
